# Patient Record
Sex: Female | Born: 1960 | Race: White | Hispanic: No | Marital: Single | Smoking: Former smoker
Health system: Southern US, Community
[De-identification: ages and names within clinical notes are randomized; demographics above are authoritative.]

## PROBLEM LIST (undated history)

## (undated) DIAGNOSIS — Z86010 Personal history of colonic polyps: Secondary | ICD-10-CM

## (undated) DIAGNOSIS — D649 Anemia, unspecified: Secondary | ICD-10-CM

## (undated) DIAGNOSIS — J45909 Unspecified asthma, uncomplicated: Secondary | ICD-10-CM

## (undated) DIAGNOSIS — T4145XA Adverse effect of unspecified anesthetic, initial encounter: Secondary | ICD-10-CM

## (undated) DIAGNOSIS — N189 Chronic kidney disease, unspecified: Secondary | ICD-10-CM

## (undated) DIAGNOSIS — R030 Elevated blood-pressure reading, without diagnosis of hypertension: Secondary | ICD-10-CM

## (undated) DIAGNOSIS — N8502 Endometrial intraepithelial neoplasia [EIN]: Secondary | ICD-10-CM

## (undated) DIAGNOSIS — T8859XA Other complications of anesthesia, initial encounter: Secondary | ICD-10-CM

## (undated) DIAGNOSIS — Z87442 Personal history of urinary calculi: Secondary | ICD-10-CM

## (undated) HISTORY — PX: COLONOSCOPY: SHX174

## (undated) HISTORY — DX: Personal history of colonic polyps: Z86.010

## (undated) HISTORY — DX: Unspecified asthma, uncomplicated: J45.909

## (undated) HISTORY — PX: COLONOSCOPY W/ POLYPECTOMY: SHX1380

---
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
## 1994-10-28 HISTORY — PX: INGUINAL HERNIA REPAIR: SUR1180

## 1996-10-28 HISTORY — PX: SALIVARY GLAND SURGERY: SHX768

## 2001-12-28 ENCOUNTER — Encounter: Admission: RE | Admit: 2001-12-28 | Discharge: 2001-12-28 | Payer: Self-pay | Admitting: Occupational Medicine

## 2001-12-28 ENCOUNTER — Encounter: Payer: Self-pay | Admitting: Occupational Medicine

## 2005-10-28 HISTORY — PX: OVARIAN CYST REMOVAL: SHX89

## 2005-10-28 HISTORY — PX: MYOMECTOMY: SHX85

## 2007-11-17 ENCOUNTER — Encounter (INDEPENDENT_AMBULATORY_CARE_PROVIDER_SITE_OTHER): Payer: Self-pay | Admitting: Obstetrics & Gynecology

## 2007-11-17 ENCOUNTER — Inpatient Hospital Stay (HOSPITAL_COMMUNITY): Admission: RE | Admit: 2007-11-17 | Discharge: 2007-11-19 | Payer: Self-pay | Admitting: Obstetrics & Gynecology

## 2008-01-07 ENCOUNTER — Encounter: Admission: RE | Admit: 2008-01-07 | Discharge: 2008-01-07 | Payer: Self-pay | Admitting: Obstetrics & Gynecology

## 2010-11-18 ENCOUNTER — Encounter: Payer: Self-pay | Admitting: Obstetrics & Gynecology

## 2011-03-12 NOTE — Op Note (Signed)
NAMEEARLYNE, Ashley Moon                 ACCOUNT NO.:  [id number]   MEDICAL RECORD NO.:  [id number]          PATIENT TYPE:  INP   LOCATION:  9305                          FACILITY:  [facility]   PHYSICIAN:  Freddy Finner, M.D.   DATE OF BIRTH:  08/14/1961   DATE OF PROCEDURE:  11/17/2007  DATE OF DISCHARGE:                               OPERATIVE REPORT   PREOPERATIVE DIAGNOSES:  1. Multiple uterine leiomyomata.  2. Persistent 5 cm right ovarian cyst.   POSTOPERATIVE DIAGNOSES:  1. Multiple uterine leiomyomata.  2. Persistent 5 cm right ovarian cyst.  3. Endometrioma of right ovary.   PROCEDURES:  1. Laparotomy with multiple myomectomies of uterine leiomyomata.  2. Excision of right ovarian cyst.  3. Repair of right ovary.   SURGEON:  Freddy Finner, M.D.   ASSISTANT:  Guy Sandifer. Henderson Cloud, M.D.   ESTIMATED INTRAOPERATIVE BLOOD LOSS:  1000 mL.   OTHER INTRAOPERATIVE COMPLICATIONS:  None.   ANESTHESIA:  General endotracheal.   Details of the present illness were recorded in the admission note.  The  patient was admitted on the morning of surgery.  She was given a bolus  of Ancef IV preoperatively.  She was placed in PAS serial compression  hose. She was brought to the operating room and there placed under  adequate general anesthesia, placed in the dorsal recumbent position.  With knees elevated by a pillow, the abdomen, perineum, and vagina were  prepped in the usual fashion, prepped with scrub and Betadine solution.  A Foley catheter was placed using sterile technique.  Sterile drapes  were applied.   A low abdominal transverse incision was made and carried sharply down to  fascia.  Fascia was entered sharply and extended to the external skin  incision.  Rectus sheath was developed superiorly and inferiorly with  blunt and sharp dissection.  Bleeding subcutaneous and subfascial  vessels was controlled with Bovie.  Rectus muscles were divided in the  midline.  Peritoneum was  identified and elevated with the forceps after  blunt dissection properitoneal fat.  It was then entered sharply and  extended bluntly to the extent of the skin incision.  Manual exploration  of the upper abdomen was carried out with a normal palpable liver,  normal gallbladder, normal  kidneys.  No apparent palpable  abnormalities.  The appendix was palpably normal but not visible even  with traction in the retrocecal location.  The pelvis and lower abdomen  were filled  with an enlarged uterus.  This precluded east visualization  of either ovary.  It was elected to start the myomectomy before  completely evaluating the ovaries.  An O'Connor-O'Sullivan self-  retaining retractor was placed.  More packs were used to pack the  intestinal contents  out of the pelvis.  A myoma on the posterior  uterine surface was grasped with ring forceps,  and this was used to  elevate the uterus initially.  The small defect there was closed with a  suture that was held for elevation to offer initial exposure.  A moist  pack was placed behind  the uterine lower segment and posterior uterine  surface.  Numerous myomas were palpable and visible except for one which  seemed to be intramural or submucosal.  There was one very superficial  small lesion on the posterior fundus on the lower uterine segment.  A  large incision was made across the dome of the fundus and from it the  largest myoma excised, measuring probably in the range of 6 cm in  diameter.  With careful sharp and blunt dissection, it was completely  removed.  Bleeding sources were controlled with interrupted zero  Monocryl sutures.  Additional myomas could be palpated and retrieved,  placing a finger into the endometrial cavity and identifying additional  lesions which were removed to the left of midline and inferiorly.  After  removing several myomas, all that were palpable  in this location, the  myometrium was closed with figure-of-eight of zero  Monocryl.  The  superficial incision was left for closure later.   An incision was made along the posterior fundal surface and the second  largest lesion removed, measuring probably in the range of 4 cm.  Again,  the defect was closed with interrupted figure-of-eight of zero Monocryl.  At this point, the 2 primary incisions were closed with running locking  zero Monocryl for the superficial and secondary closure which were done  in a baseball stitch fashion.   Careful palpation was continued of the uterus and lower uterine segment  in particular, and there were what seemed to be small nodules in the  extreme lower part of the anterior lower uterine segment which were  thought to be subcentimeter myomas.  The entire lower segment seemed  very symmetrical but thickened and bulbous.  For that reason, the uterus  was elevated up out of the pelvis, and incision was made over the lower  uterine segment anterior which allowed an irregular myoma with 2 smaller  ones attached to be excised.  This measured approximately 4 cm in  diameter. Some of the lesions including this one seemed to be somewhat  degenerated.  This was identified on the pathology report for special  inspection.  The removal of these lesions officially completed the  myomectomy since all the palpable lesions were completely removed. This  defect was closed with interrupted figure-of-eight sutures of zero  Monocryl for both the deep and superficial excision.   Additional moist packs were placed into the abdomen and pelvis  posteriorly to elevate the cystically enlarged right ovary and to bring  it into the operative field.  A scalpel blade was then used to incise  the cortex of the ovary, and the process of the ovary spilled a  chocolate material which was one of the differential diagnoses of this  particular cyst that it was endometriotic.  The cyst lining was  progressively sharply dissected free from the ovary as  much as   possible, and small segments of cortex excised to completely remove the  cyst and the cyst lining with the exception of small areas at the most  inferior portion which, in fact, is the portion of the ovary nearest the  utero-ovarian ligament.  This area was very close to the ovarian  medullary blood supply, and it was elected to fulgurate what small  remaining tissue in the lining was present there.  The ovary was then  closed with 3-0 Monocryl with a running locking superficial layer.  A  figure-of-eight and 2 mattress sutures were required for medullary  hemostasis  after closing the superficial layer of the ovary.  Copious  irritation was then carried out.  It was felt that hemostasis was  complete.  All pack, needle, and instruments were then removed from the  abdomen.  Counts were correct.  The abdominal incision was closed in  layers with running zero Monocryl to close the peritoneum and  reapproximate the  rectus muscles. Double-looped zero PDS was used to close the fascia,  running from angle to angle.  Subcutaneous tissue was approximated with  running 2-0 plain.  Skin was closed with wide skin staples and 1/4 inch  Steri-Strips.  The patient was awakened, taken to the recovery room in  good condition.      Freddy Finner, M.D.  Electronically Signed     WRN/MEDQ  D:  11/17/2007  T:  11/17/2007  Job:  [id number]

## 2011-03-12 NOTE — H&P (Signed)
Ashley Moon, Ashley Moon                 ACCOUNT NO.:  [id number]   MEDICAL RECORD NO.:  [id number]          PATIENT TYPE:  AMB   LOCATION:  SDC                           FACILITY:  [facility]   PHYSICIAN:  Freddy Finner, M.D.   DATE OF BIRTH:  06-Sep-1961   DATE OF ADMISSION:  11/17/2007  DATE OF DISCHARGE:                              HISTORY & PHYSICAL   ADMITTING DIAGNOSES:  1. Persistent large right ovarian cyst.  2. Multiple large uterine leiomyomata.   The patient is a 50 year old single white female, nulligravida, who was  first seen in December 2007 following an ultrasound at another facility  showing uterine fibroids.  She also has history of anemia and  menorrhagia.  Following that office visit, which was remarkable for  marked enlargement of the uterus extending up to approximately the  umbilicus and filling the pelvis, she had a pelvic ultrasound which  showed multiple large uterine leiomyomata and showed a 5.1 x 2.8 x 3.3  cm cyst in the right adnexa consistent with possible hemorrhagic cyst  versus endometrioma.  The the patient emphatically stated that it was  not possible for her to have surgery at that time in spite of  recommendations for her to do so and she has been followed with serial  ultrasounds over the next year and a half which I have confirmed  stability of the right adnexal mass but have shown it to persist and  relative stability of the uterine leiomyomata.  She has reached the  point where she is willing to proceed with surgery and is admitted now  for an open procedure for right ovarian cystectomy and myomectomy.   CURRENT REVIEW OF SYSTEMS:  Is negative.  Her menorrhagia has been  controlled with YAZ oral contraceptives.  She denies any  cardiopulmonary, GI or GU complaints.   PAST MEDICAL HISTORY:  The patient has no known significant medical  illnesses.  Her only chronic medication is oral contraceptive and iron  supplement.   PREVIOUS SURGICAL PROCEDURES  INCLUDE:  1. Hernia repair in 1996.  2. Resection of a left salivary gland in 1997.  3. Appendectomy in 1996.   DRUG ALLERGIES:  Are to SULFA AND MORPHINE.   FAMILY HISTORY:  Noncontributory.   PHYSICAL EXAM:  HEENT:  Is grossly within normal limits.  Blood pressure  the office 140/90.  Thyroid gland is not palpably enlarged.  CHEST:  Is clear to auscultation throughout.  HEART:  Normal sinus rhythm without murmurs, rubs or gallops.  BREAST EXAM:  Was considered to be normal.  No palpable masses.  No skin  change or nipple discharge.  ABDOMEN:  Is remarkable only for the uterus which was palpable nearly to  the level of the umbilicus.  There is no other apparent organomegaly or  CVA tenderness.  PELVIC EXAMINATION:  External genitalia, vagina and cervix are normal.  Bimanual reveals the uterus to be anterior in position and filling the  pelvis and rising up to approximately the umbilicus.  The right adnexal  mass is not palpably separate from the uterine mass.  RECTUM:  Is palpably normal and confirms the above findings.  EXTREMITIES: Without cyanosis, clubbing or edema.   ASSESSMENT:  Large right ovarian cyst, persistent multiple large uterine  leiomyomata.   PLAN:  Laparotomy with myomectomy and right ovarian cystectomy.  The  operative procedure has been discussed at length with the patient  including the possibility of having to perform a hysterectomy, although  this is done only for life-threatening intraoperative complications or  inability to achieve the procedure planned.  She has also been counseled  on the potential risk of infection, hemorrhage and injury to other  organs.  The patient is now admitted and prepared to proceed with  surgery.      Freddy Finner, M.D.  Electronically Signed     WRN/MEDQ  D:  11/16/2007  T:  11/16/2007  Job:  [id number]

## 2011-03-15 NOTE — Discharge Summary (Signed)
Ashley Moon, Ashley Moon                 ACCOUNT NO.:  [id number]   MEDICAL RECORD NO.:  [id number]          PATIENT TYPE:  INP   LOCATION:  9305                          FACILITY[facility]   PHYSICIAN:  Freddy Finner, M.D.   DATE OF BIRTH:  08-09-1961   DATE OF ADMISSION:  11/17/2007  DATE OF DISCHARGE:  11/19/2007                               DISCHARGE SUMMARY   DISCHARGE DIAGNOSES:  1. Multiple uterine leiomyomata.  2. Benign endometriotic cyst of left ovary.   OPERATIVE PROCEDURE:  Exploratory laparotomy with multiple excision of  leiomyomata totaling number of 15 with __________  ranging from 0.5 to 6  cm plus left ovarian cystectomy and repair of ovary.  Intraoperative  complications none, although moderate blood loss was encountered because  of the multiple leiomyomata.  Postoperative complications:  None.   DISPOSITION:  The patient was in satisfactory improved condition at the  time of her discharge on the second postoperative day.  She is to have  progressively increasing physical activity.  She is to take a vitamin  supplement with iron.  She is given Percocet to be taken as needed for  postoperative pain.  She is to return to the office in two weeks for  postoperative visit.  She is call for fever, heavy vaginal bleeding or  severe pain.  She is to take a regular diet.  She is to avoid strenuous  activities.   Details of Present Illness, Past History, Family History, Review of  Systems, and Physical Exam are recorded in the admission note.  The  patient has had a longstanding complex left adnexal mass dating back to  2007.  She also has numerous leiomyomata which have been documented by  ultrasound also.  For logistical reasons she has refused surgery until  the present time.  She now has a window of opportunity appropriate for  her working situation to do the surgery and is admitted now for  myomectomy and for ovarian cystectomy.  Physical findings on admission  remarkable  only for the pelvic findings with marked enlargement of the  uterus to a level approximately 2-3 cm below the umbilicus.   LABORATORY DATA:  Preoperative laboratory data included a normal CBC  with hemoglobin of 15.6, normal prothrombin time, PTT, and urinalysis.  Postoperatively her hemoglobin fell to a level of 9.8 which is  appropriate for the estimated loss of blood.   HOSPITAL COURSE:  The patient was admitted on the morning of surgery.  Perioperatively she was given an antibiotic.  She was placed in PAS  hose.  The above described procedure was accomplished without any  serious complications.  She did have an approximately 1000 mL blood loss  which is not unexpected for this procedure.  Postoperatively she did  very well.  She remained afebrile throughout the hospital stay.  By the  morning of the  second day she was ambulating without difficulty, tolerating a regular  diet, having adequate bowel and bladder function.  The skin staples were  removed.  Steri-Strips were left in place.  She was discharged home with  disposition as noted above.      Freddy Finner, M.D.  Electronically Signed     WRN/MEDQ  D:  11/20/2007  T:  11/20/2007  Job:  [id number]

## 2011-07-18 LAB — CBC
HCT: 26.6 — ABNORMAL LOW
HCT: 44.5
Hemoglobin: 15.4 — ABNORMAL HIGH
MCHC: 34.5
MCHC: 34.6
MCV: 88.7
MCV: 88.7
MCV: 88.7
MCV: 89.2
Platelets: 215
Platelets: 235
Platelets: 312
RBC: 3 — ABNORMAL LOW
RBC: 3.23 — ABNORMAL LOW
RBC: 3.87
RBC: 5.02
RDW: 14.1
RDW: 14.3
WBC: 11.8 — ABNORMAL HIGH
WBC: 6.1
WBC: 9.6

## 2011-07-18 LAB — APTT: aPTT: 32

## 2011-07-18 LAB — DIFFERENTIAL
Basophils Absolute: 0
Basophils Relative: 1
Eosinophils Absolute: 0.2
Eosinophils Relative: 3
Lymphocytes Relative: 23
Lymphs Abs: 1.4
Monocytes Absolute: 0.7
Monocytes Relative: 12
Neutro Abs: 3.8
Neutrophils Relative %: 61

## 2011-07-18 LAB — PROTIME-INR
INR: 1
Prothrombin Time: 12.9

## 2011-07-18 LAB — URINALYSIS, ROUTINE W REFLEX MICROSCOPIC
Bilirubin Urine: NEGATIVE
Hgb urine dipstick: NEGATIVE
Ketones, ur: 15 — AB
Nitrite: NEGATIVE
Protein, ur: NEGATIVE
Specific Gravity, Urine: 1.025
Urobilinogen, UA: 0.2

## 2011-07-23 ENCOUNTER — Ambulatory Visit: Payer: Self-pay

## 2011-07-23 ENCOUNTER — Other Ambulatory Visit: Payer: Self-pay | Admitting: Occupational Medicine

## 2011-07-23 DIAGNOSIS — M79672 Pain in left foot: Secondary | ICD-10-CM

## 2012-07-13 ENCOUNTER — Ambulatory Visit
Admission: RE | Admit: 2012-07-13 | Discharge: 2012-07-13 | Disposition: A | Discharge status: Home / Self Care | Payer: Worker's Compensation | Source: Ambulatory Visit | Attending: Occupational Medicine | Admitting: Occupational Medicine

## 2012-07-13 ENCOUNTER — Other Ambulatory Visit: Payer: Self-pay | Admitting: Occupational Medicine

## 2012-07-13 DIAGNOSIS — S9030XA Contusion of unspecified foot, initial encounter: Secondary | ICD-10-CM

## 2012-10-11 IMAGING — CR DG FOOT COMPLETE 3+V*L*
4 series · 4 of 4 positions shown · non-contrast
Comparison: None.

CLINICAL DATA: Blunt trauma.  Swelling of first and second toes.

LEFT FOOT - COMPLETE 3+ VIEW

[view not recorded (1 of 4)]
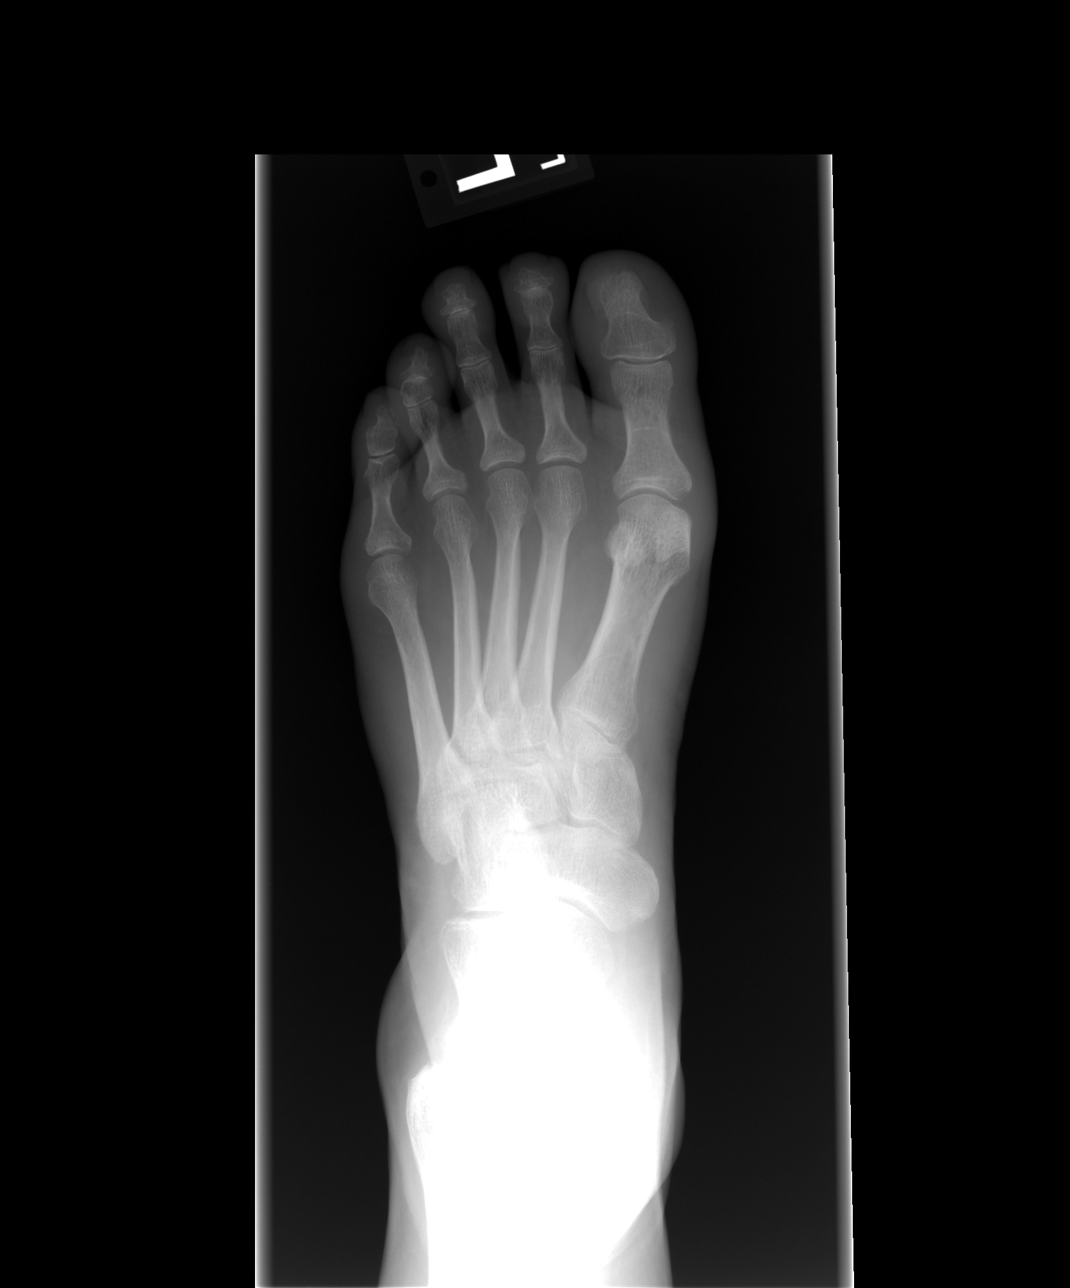

[view not recorded (2 of 4)]
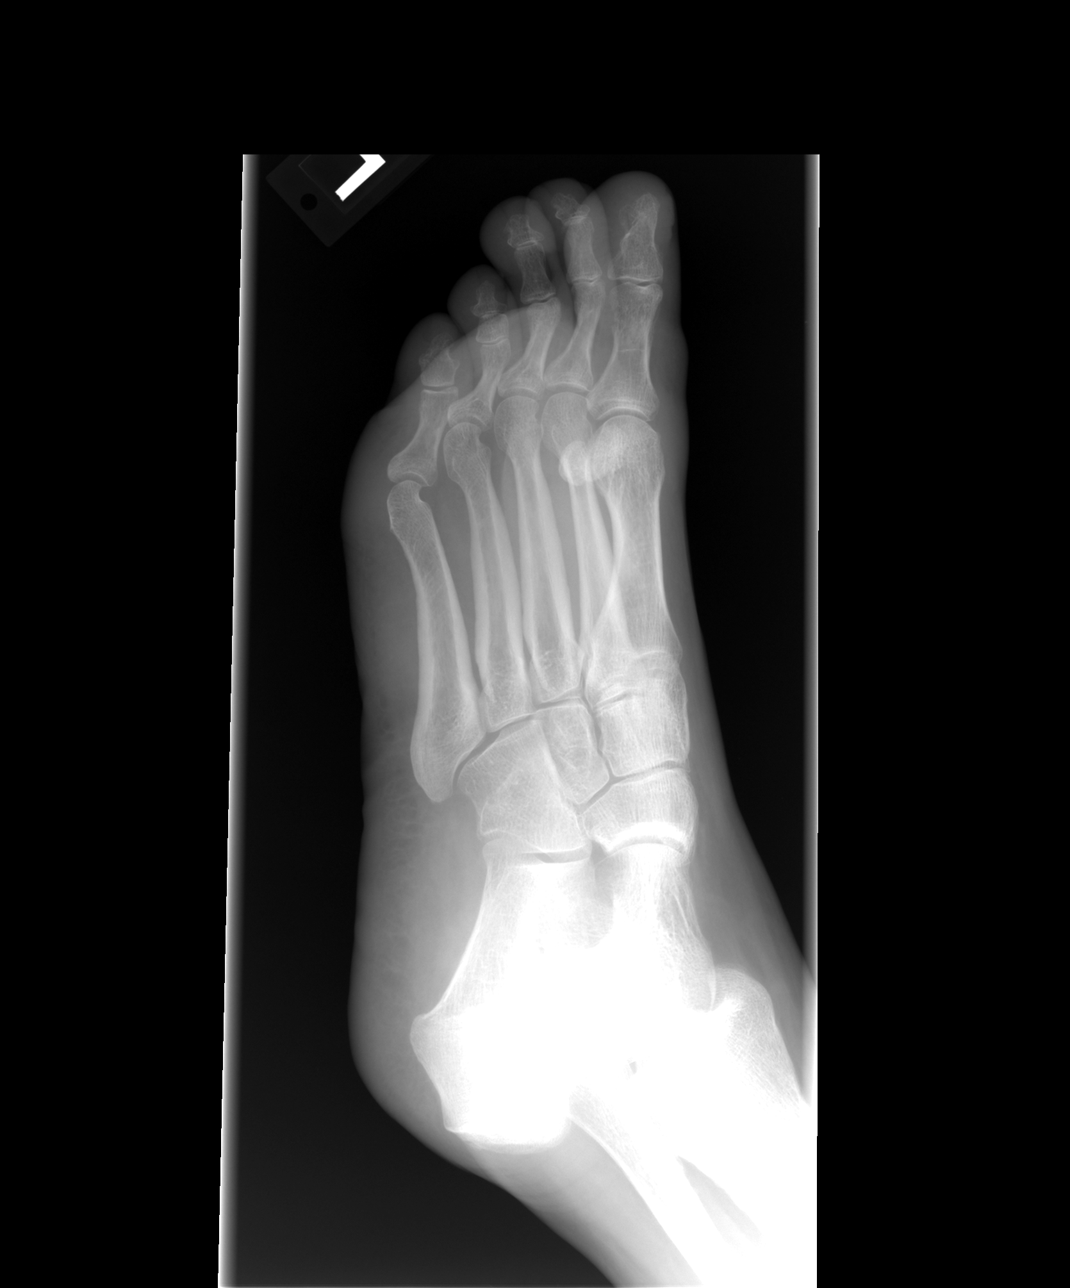

[view not recorded (3 of 4)]
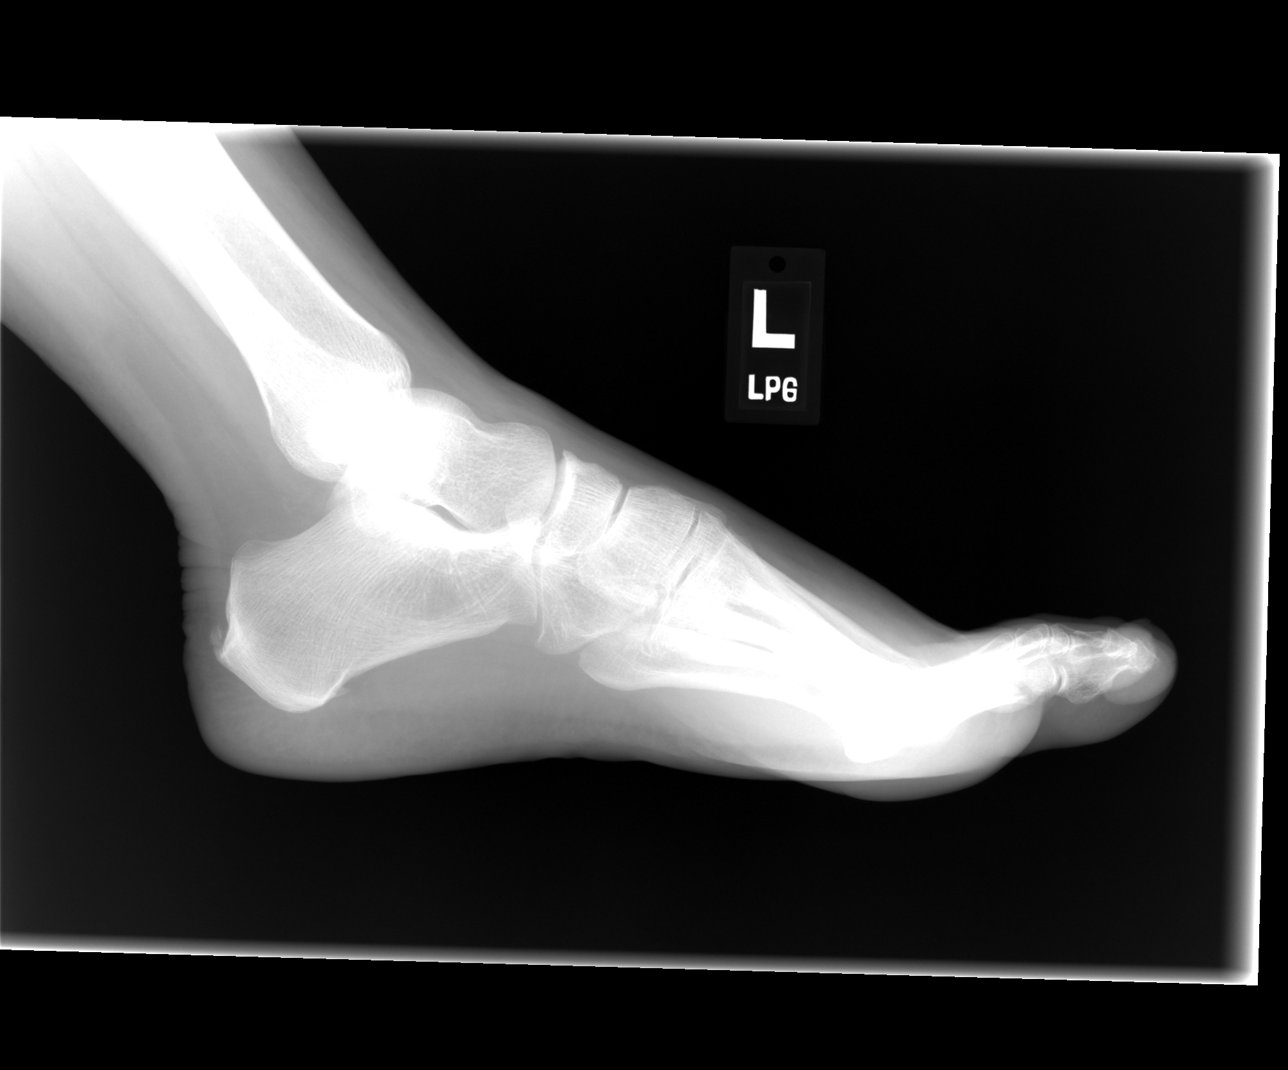

[view not recorded (4 of 4)]
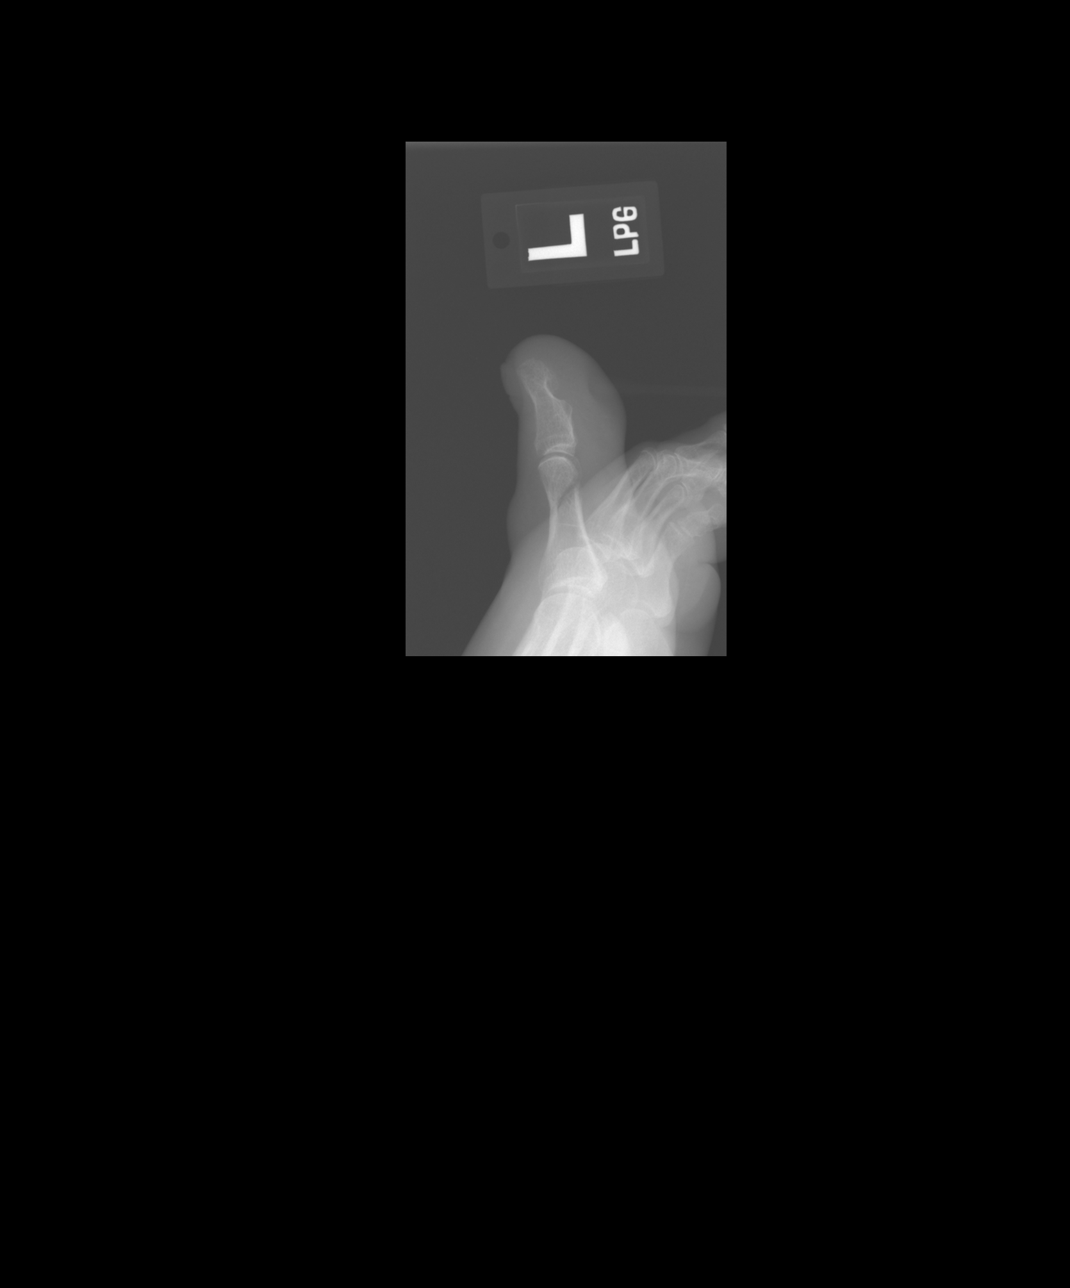

[4 of 4 positions shown; findings below may reference images not displayed]

FINDINGS: There is a minimally-displaced fracture through the
proximal phalanx of the left great toe.  No visible intra-articular
extension.  Overlying soft tissue swelling.
IMPRESSION: Oblique fracture through the shaft of the left first proximal
phalanx.

## 2013-07-28 ENCOUNTER — Encounter: Payer: Self-pay | Admitting: Internal Medicine

## 2013-10-02 IMAGING — CR DG FOOT COMPLETE 3+V*L*
3 series · 3 of 3 positions shown · non-contrast
Comparison: 07/23/2011

CLINICAL DATA: Pain and swelling at first and second toes, injury 2
days ago

LEFT FOOT - COMPLETE 3+ VIEW

[t foot ap left]
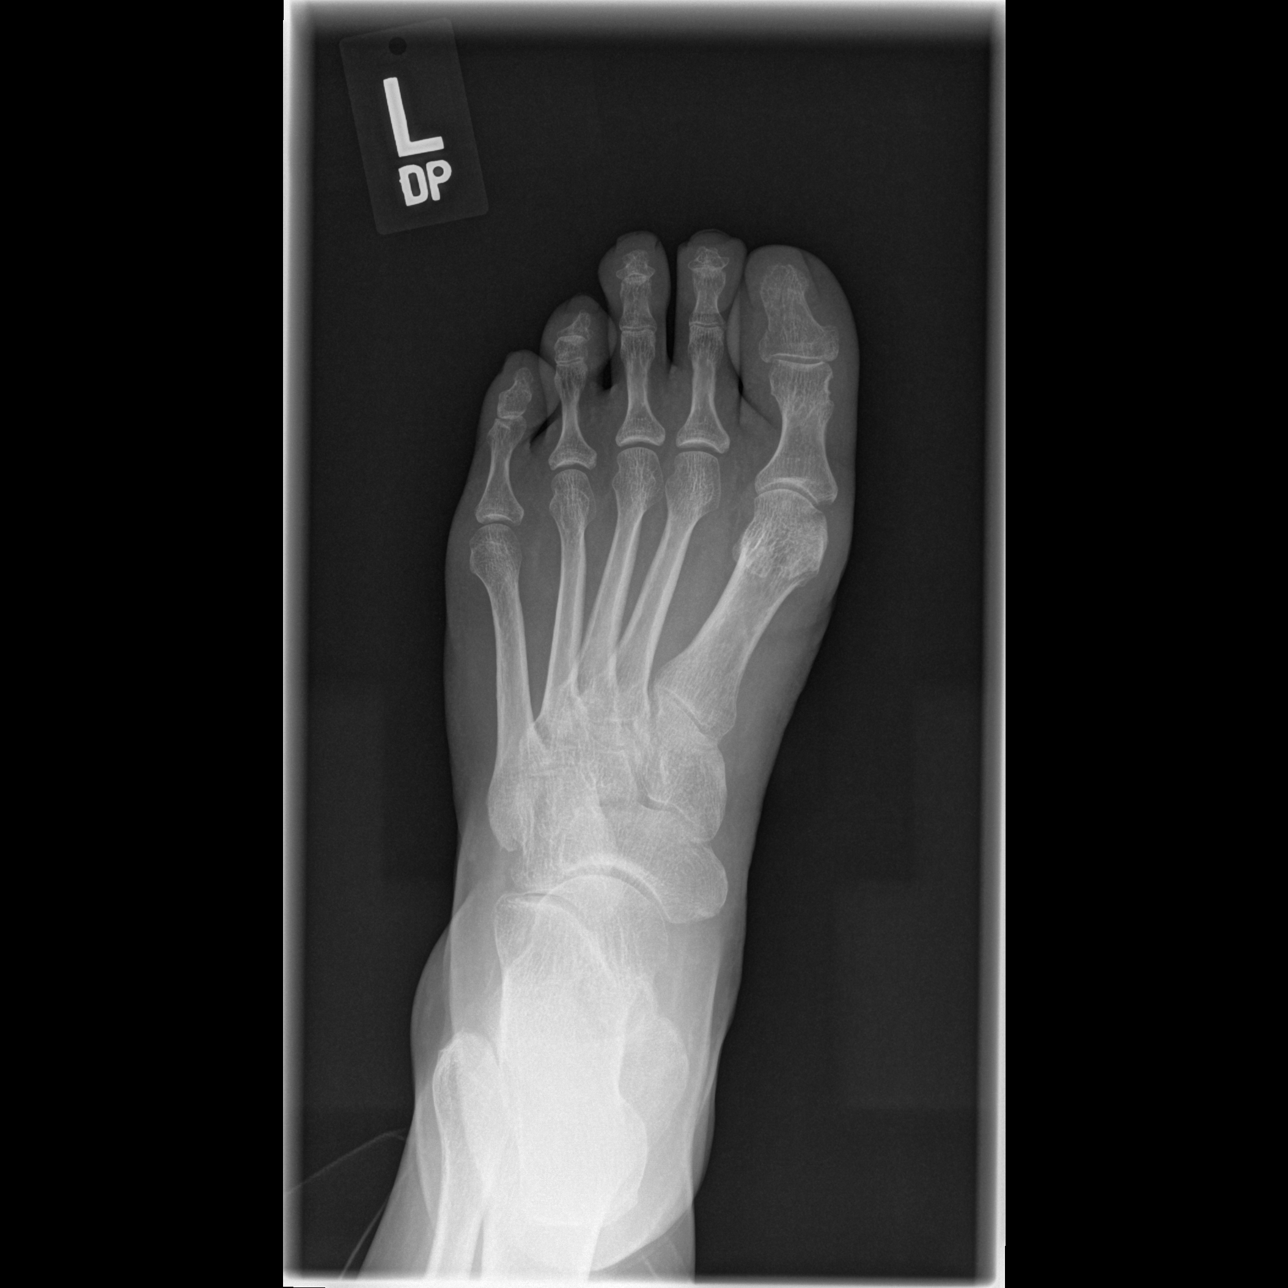

[t foot oblique left]
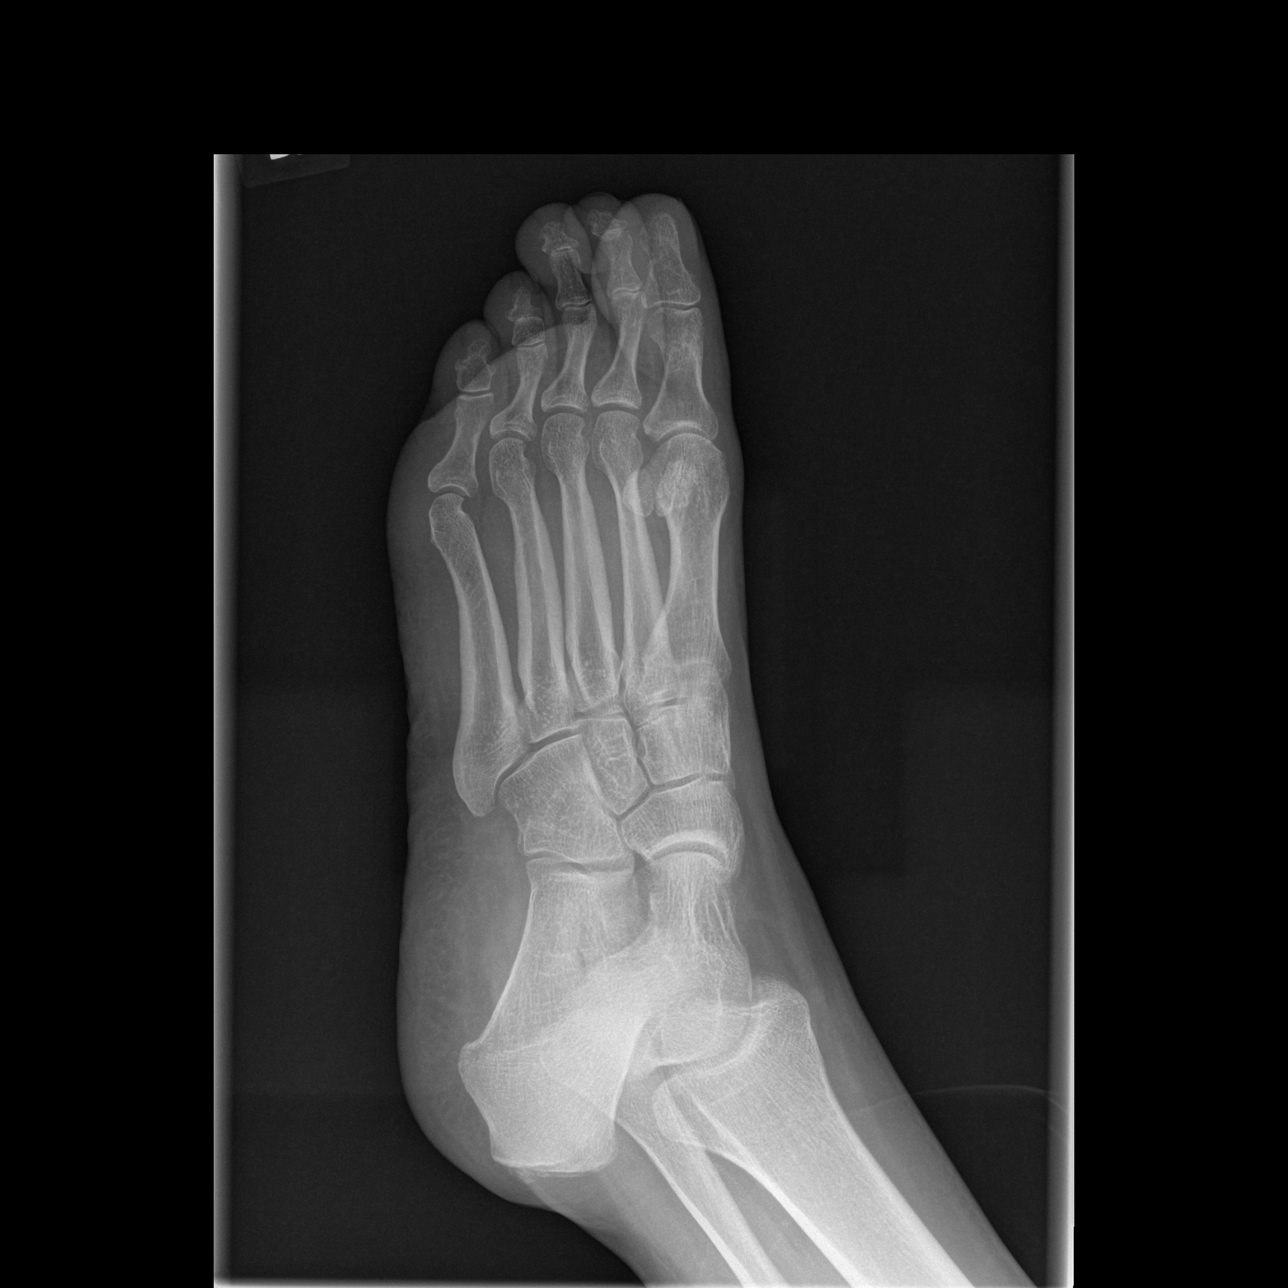

[t foot lat left]
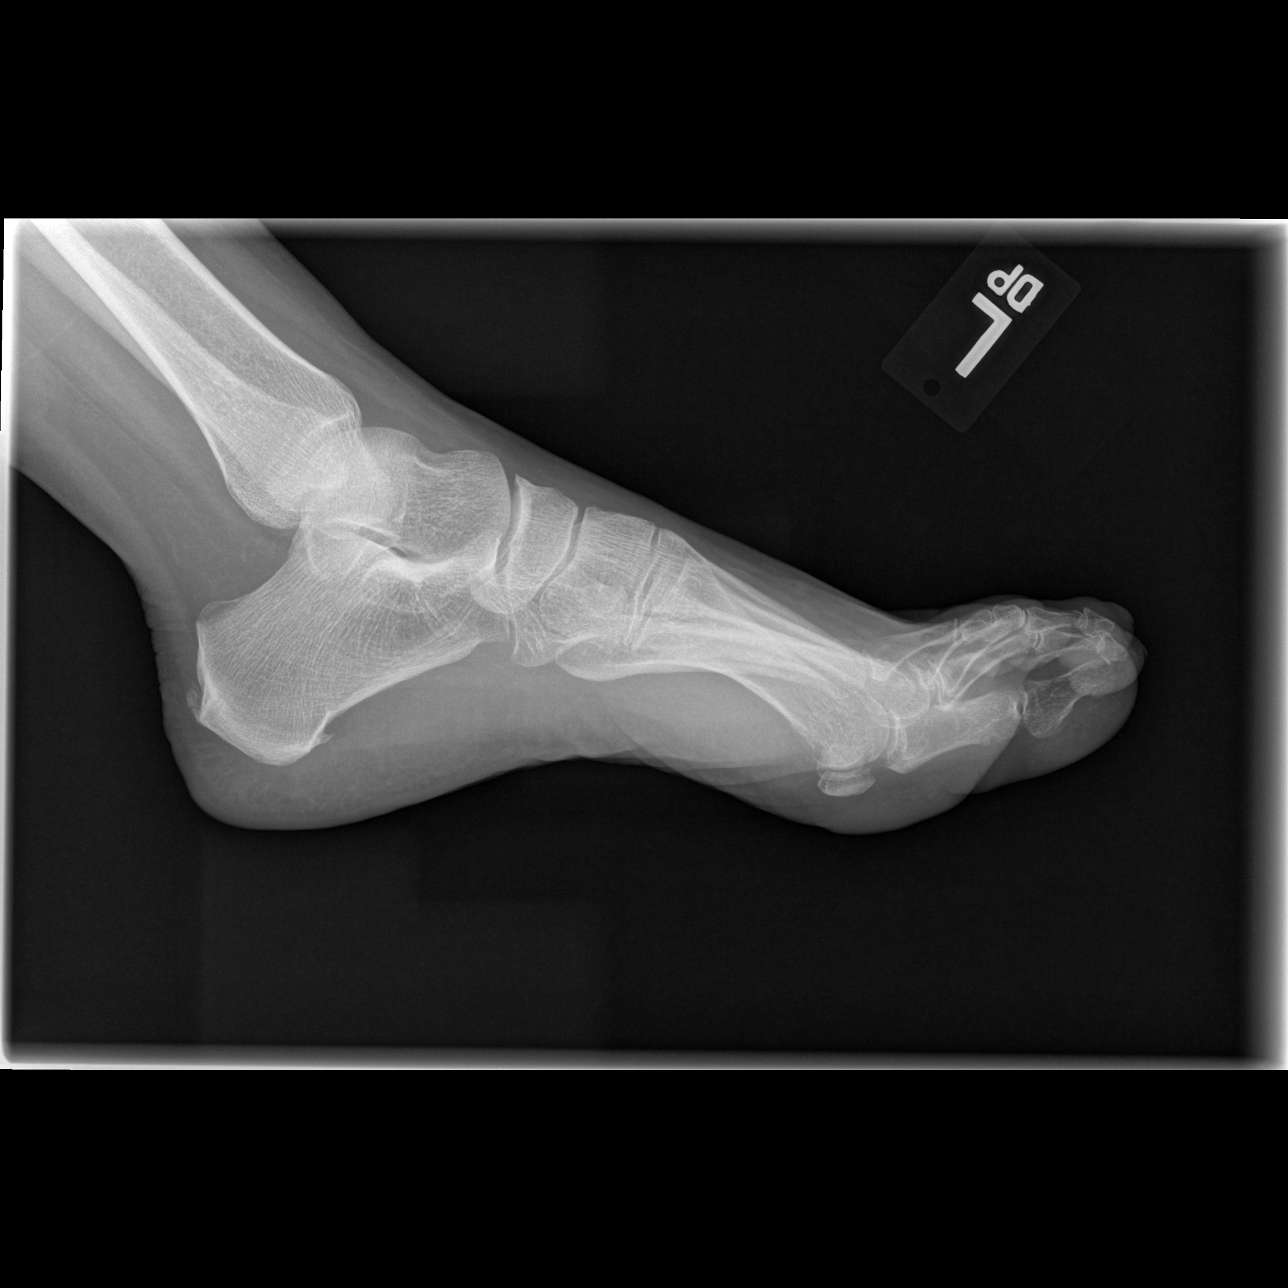

[3 of 3 positions shown; findings below may reference images not displayed]

FINDINGS: Osseous demineralization.
Nondisplaced fracture identified at base of the distal phalanx
great toe.
No definite intra-articular extension.
Questionable longitudinal component of fracture extending towards
the tuft.
Joint spaces preserved.
Deformity of proximal phalanx great toe compatible with sequela of
previous fracture.
No additional fracture, dislocation, or bone destruction.
IMPRESSION: Nondisplaced fracture at base of distal phalanx left great toe as
above.
Old healed fracture at proximal phalanx left great toe.
Osseous demineralization.

## 2013-10-14 ENCOUNTER — Encounter: Payer: Self-pay | Admitting: Internal Medicine

## 2013-11-15 ENCOUNTER — Ambulatory Visit (AMBULATORY_SURGERY_CENTER): Payer: Self-pay | Admitting: *Deleted

## 2013-11-15 VITALS — Ht 63.75 in | Wt 194.4 lb

## 2013-11-15 DIAGNOSIS — Z1211 Encounter for screening for malignant neoplasm of colon: Secondary | ICD-10-CM

## 2013-11-15 MED ORDER — NA SULFATE-K SULFATE-MG SULF 17.5-3.13-1.6 GM/177ML PO SOLN: 1.0000 | Freq: Once | ORAL | Status: DC

## 2013-11-15 NOTE — Progress Notes (Signed)
No allergies to eggs or soy. No problems with anesthesia.  

## 2013-11-17 ENCOUNTER — Encounter: Payer: Self-pay | Admitting: Internal Medicine

## 2013-11-29 ENCOUNTER — Ambulatory Visit (AMBULATORY_SURGERY_CENTER): Payer: No Typology Code available for payment source | Admitting: Internal Medicine

## 2013-11-29 ENCOUNTER — Encounter: Payer: Self-pay | Admitting: Internal Medicine

## 2013-11-29 VITALS — BP 141/72 | HR 68 | Temp 99.2°F | Resp 28 | Ht 63.75 in | Wt 194.0 lb

## 2013-11-29 DIAGNOSIS — Z860100 Personal history of colon polyps, unspecified: Secondary | ICD-10-CM | POA: Insufficient documentation

## 2013-11-29 DIAGNOSIS — Z1211 Encounter for screening for malignant neoplasm of colon: Secondary | ICD-10-CM

## 2013-11-29 DIAGNOSIS — D126 Benign neoplasm of colon, unspecified: Secondary | ICD-10-CM

## 2013-11-29 HISTORY — DX: Personal history of colonic polyps: Z86.010

## 2013-11-29 MED ORDER — SODIUM CHLORIDE 0.9 % IV SOLN: 500.0000 mL | INTRAVENOUS | Status: DC

## 2013-11-29 NOTE — Progress Notes (Signed)
Called to room to assist during endoscopic procedure.  Patient ID and intended procedure confirmed with present staff. Received instructions for my participation in the procedure from the performing physician.  

## 2013-11-29 NOTE — Op Note (Signed)
[facility]  [address] [place], [id number]   COLONOSCOPY PROCEDURE REPORT  PATIENT: Ashley Moon, Ashley Moon  MR#: [id number] BIRTHDATE: 1961-02-17 , 52  yrs. old GENDER: Female ENDOSCOPIST: Gatha Mayer, MD, [facility] REFERRED ZG:YFVCBS Nori Riis, M.D. PROCEDURE DATE:  11/29/2013 PROCEDURE:   Colonoscopy with snare polypectomy First Screening Colonoscopy - Avg.  risk and is 50 yrs.  old or older Yes.  Prior Negative Screening - Now for repeat screening. N/A  History of Adenoma - Now for follow-up colonoscopy & has been > or = to 3 yrs.  N/A  Polyps Removed Today? Yes. ASA CLASS:   Class II INDICATIONS:average risk screening and first colonoscopy. MEDICATIONS: propofol (Diprivan) 300mg  IV, MAC sedation, administered by CRNA, and These medications were titrated to patient response per physician's verbal order  DESCRIPTION OF PROCEDURE:   After the risks benefits and alternatives of the procedure were thoroughly explained, informed consent was obtained.  A digital rectal exam revealed no abnormalities of the rectum.   The [facility] [id number]-H190 [id number]  endoscope was introduced through the anus and advanced to the cecum, which was identified by both the appendix and ileocecal valve. No adverse events experienced.   The quality of the prep was Suprep good  The instrument was then slowly withdrawn as the colon was fully examined.   COLON FINDINGS: Four diminutive sessile polyps were found in the transverse colon, descending colon, and sigmoid colon.  A polypectomy was performed with a cold snare.  The resection was complete and the polyp tissue was completely retrieved.   The colon mucosa was otherwise normal.   A right colon retroflexion was performed.  Retroflexed views revealed no abnormalities. The time to cecum=4 minutes 32 seconds.  Withdrawal time=7 minutes 28 seconds.  The scope was withdrawn and the procedure completed. COMPLICATIONS: There were no complications.  ENDOSCOPIC  IMPRESSION: 1.   Four diminutive sessile polyps were found in the transverse colon, descending colon, and sigmoid colon; polypectomy was performed with a cold snare 2.   The colon mucosa was otherwise normal - good prep - first colonoscopy  RECOMMENDATIONS: Timing of repeat colonoscopy will be determined by pathology findings.   eSigned:  Gatha Mayer, MD, [facility] 11/29/2013 9:13 AM   cc: The Patient and Evette Cristal, MD

## 2013-11-29 NOTE — Patient Instructions (Addendum)
I found and removed 4 small polyps that look benign.  Otherwise the colon was normal.  I will let you know pathology results and when to have another routine colonoscopy by mail.  I appreciate the opportunity to care for you. Gatha Mayer, MD, FACG   YOU HAD AN ENDOSCOPIC PROCEDURE TODAY AT [facility]: Refer to the procedure report that was given to you for any specific questions about what was found during the examination.  If the procedure report does not answer your questions, please call your gastroenterologist to clarify.  If you requested that your care partner not be given the details of your procedure findings, then the procedure report has been included in a sealed envelope for you to review at your convenience later.  YOU SHOULD EXPECT: Some feelings of bloating in the abdomen. Passage of more gas than usual.  Walking can help get rid of the air that was put into your GI tract during the procedure and reduce the bloating. If you had a lower endoscopy (such as a colonoscopy or flexible sigmoidoscopy) you may notice spotting of blood in your stool or on the toilet paper. If you underwent a bowel prep for your procedure, then you may not have a normal bowel movement for a few days.  DIET: Your first meal following the procedure should be a light meal and then it is ok to progress to your normal diet.  A half-sandwich or bowl of soup is an example of a good first meal.  Heavy or fried foods are harder to digest and may make you feel nauseous or bloated.  Likewise meals heavy in dairy and vegetables can cause extra gas to form and this can also increase the bloating.  Drink plenty of fluids but you should avoid alcoholic beverages for 24 hours.  ACTIVITY: Your care partner should take you home directly after the procedure.  You should plan to take it easy, moving slowly for the rest of the day.  You can resume normal activity the day after the procedure however you should  NOT DRIVE or use heavy machinery for 24 hours (because of the sedation medicines used during the test).    SYMPTOMS TO REPORT IMMEDIATELY: A gastroenterologist can be reached at any hour.  During normal business hours, 8:30 AM to 5:00 PM Monday through Friday, call [phone].  After hours and on weekends, please call the GI answering service at [phone] who will take a message and have the physician on call contact you.   Following lower endoscopy (colonoscopy or flexible sigmoidoscopy):  Excessive amounts of blood in the stool  Significant tenderness or worsening of abdominal pains  Swelling of the abdomen that is new, acute  Fever of 100F or higher    FOLLOW UP: If any biopsies were taken you will be contacted by phone or by letter within the next 1-3 weeks.  Call your gastroenterologist if you have not heard about the biopsies in 3 weeks.  Our staff will call the home number listed on your records the next business day following your procedure to check on you and address any questions or concerns that you may have at that time regarding the information given to you following your procedure. This is a courtesy call and so if there is no answer at the home number and we have not heard from you through the emergency physician on call, we will assume that you have returned to your regular daily activities without  incident.  SIGNATURES/CONFIDENTIALITY: You and/or your care partner have signed paperwork which will be entered into your electronic medical record.  These signatures attest to the fact that that the information above on your After Visit Summary has been reviewed and is understood.  Full responsibility of the confidentiality of this discharge information lies with you and/or your care-partner.

## 2013-11-30 ENCOUNTER — Telehealth: Payer: Self-pay | Admitting: *Deleted

## 2013-11-30 NOTE — Telephone Encounter (Signed)
  Follow up Call-  Call back number 11/29/2013  Post procedure Call Back phone  # [phone]  Permission to leave phone message Yes     Patient questions:  Do you have a fever, pain , or abdominal swelling? no Pain Score  0 *  Have you tolerated food without any problems? yes  Have you been able to return to your normal activities? yes  Do you have any questions about your discharge instructions: Diet   no Medications  no Follow up visit  no  Do you have questions or concerns about your Care? no  Actions: * If pain score is 4 or above: No action needed, pain <4.

## 2013-12-02 ENCOUNTER — Encounter: Payer: Self-pay | Admitting: Internal Medicine

## 2013-12-02 NOTE — Progress Notes (Signed)
Quick Note:  3 diminutive adenomas Repeat colonoscopy 2018 ______

## 2014-06-02 ENCOUNTER — Other Ambulatory Visit: Payer: Self-pay | Admitting: Obstetrics & Gynecology

## 2014-06-03 LAB — CYTOLOGY - PAP

## 2014-07-25 ENCOUNTER — Ambulatory Visit (INDEPENDENT_AMBULATORY_CARE_PROVIDER_SITE_OTHER): Payer: No Typology Code available for payment source | Admitting: Family Medicine

## 2014-07-25 VITALS — BP 128/84 | HR 82 | Temp 97.8°F | Resp 18 | Ht 62.5 in | Wt 196.0 lb

## 2014-07-25 DIAGNOSIS — IMO0002 Reserved for concepts with insufficient information to code with codable children: Secondary | ICD-10-CM

## 2014-07-25 DIAGNOSIS — Z23 Encounter for immunization: Secondary | ICD-10-CM

## 2014-07-25 DIAGNOSIS — S39011A Strain of muscle, fascia and tendon of abdomen, initial encounter: Secondary | ICD-10-CM

## 2014-07-25 MED ORDER — MELOXICAM 15 MG PO TABS: 15.0000 mg | ORAL_TABLET | Freq: Every day | ORAL | Status: DC

## 2014-07-25 NOTE — Progress Notes (Signed)
Subjective:    Patient ID: Ashley Moon, female    DOB: 20-Sep-1961, 53 y.o.   MRN: [id number]  HPI Patient was walking yesterday when she turned to her left and heard and felt a popping in her right hip and lower abdomen. She has pain with bending forward, a little with walking, none with sitting still. Pain radiates into abdomen and into back from right hip. No pain in buttock or down leg. No numbness or tingling, no weakness. Frequent awakening last night from discomfort with movement. Wanted to make sure nothing was "broken."   Took tylenol without relief.   She has a history of inguinal hernia repair 1996. Has seen Dr. Nori Riis in the past for primary care, is interested in establishing care with a provider at [facility].  Past Medical History  Diagnosis Date  . Asthma   . Personal history of colonic polyps - adenomas 11/29/2013   Past Surgical History  Procedure Laterality Date  . Appendectomy  1983  . Inguinal hernia repair Right 1996  . Salivary gland surgery Left 1998  . Myomectomy  2005   Family History  Problem Relation Age of Onset  . Kidney disease Father   . Diabetes Father   . Inflammatory bowel disease Father    History  Substance Use Topics  . Smoking status: Former Smoker    Quit date: 10/29/2003  . Smokeless tobacco: Never Used  . Alcohol Use: No     Review of Systems No fever, no chills, no dysuria, no frequency, no hematuria, no diarrhea/constipation, no incontinence of bowel or bladder, occasional nausea with pain.     Objective:   Physical Exam  Vitals reviewed. Constitutional: She is oriented to person, place, and time. She appears well-developed and well-nourished.  HENT:  Head: Normocephalic and atraumatic.  Right Ear: External ear normal.  Left Ear: External ear normal.  Eyes: Conjunctivae are normal. Pupils are equal, round, and reactive to light.  Neck: Normal range of motion. Neck supple. No thyromegaly present.  Cardiovascular: Normal rate,  regular rhythm and normal heart sounds.   Pulmonary/Chest: Effort normal and breath sounds normal.  Abdominal: Soft. Bowel sounds are normal. She exhibits no distension and no mass. There is no tenderness. There is no rebound and no guarding.  Examined patient's abdomen supine and standing- non tender, no bulging/hernia.   Musculoskeletal: Normal range of motion. She exhibits no edema.       Right hip: She exhibits normal range of motion, normal strength, no tenderness, no bony tenderness, no swelling, no crepitus and no deformity.  Patient with normal gait. Able to sit/stand/lie down slowly, but without assistance. Right anterior hip/RLQ with pain with hip flexion. Full ROM, unable to reproduce pain with palpation. No pain with adduction/abduction. No trochanter tenderness.   Lymphadenopathy:    She has no cervical adenopathy.  Neurological: She is alert and oriented to person, place, and time.  Skin: Skin is warm and dry.  Psychiatric: She has a normal mood and affect. Her behavior is normal. Judgment and thought content normal.      Assessment & Plan:  1. Abdominal muscle strain, initial encounter - meloxicam (MOBIC) 15 MG tablet; Take 1 tablet (15 mg total) by mouth daily.  Dispense: 30 tablet; Refill: 0 -Provided written and verbal information regarding diagnosis and treatment. -moist heat, gentle ROM 3-4 x day -RTC if no improvement in 2-3 days, sooner if increasing pain, bowel/bladder changes, evidence of hernia.  2. Flu vaccine need - Flu  Vaccine QUAD 36+ mos IM  -F/U at 102 for CPE  Elby Beck, FNP-BC  [facility] [place] [facility]  07/25/2014 7:28 PM

## 2014-07-25 NOTE — Patient Instructions (Signed)

## 2014-10-31 ENCOUNTER — Ambulatory Visit (INDEPENDENT_AMBULATORY_CARE_PROVIDER_SITE_OTHER): Payer: No Typology Code available for payment source | Admitting: Family Medicine

## 2014-10-31 VITALS — BP 142/90 | HR 71 | Temp 99.1°F | Resp 18 | Ht 62.5 in | Wt 192.0 lb

## 2014-10-31 DIAGNOSIS — L089 Local infection of the skin and subcutaneous tissue, unspecified: Secondary | ICD-10-CM

## 2014-10-31 DIAGNOSIS — M79642 Pain in left hand: Secondary | ICD-10-CM

## 2014-10-31 DIAGNOSIS — L600 Ingrowing nail: Secondary | ICD-10-CM

## 2014-10-31 MED ORDER — CEPHALEXIN 500 MG PO CAPS: 500.0000 mg | ORAL_CAPSULE | Freq: Four times a day (QID) | ORAL | Status: DC

## 2014-10-31 NOTE — Patient Instructions (Addendum)
INGROWN TOENAIL . Keep area clean, dry and bandaged for 24 hours. . After 24 hours, remove outer bandage and leave yellow gauze in place. Ashley Moon toe/foot in warm soapy water for 5-10 minutes, once daily for 5 days. Rebandage toe after each cleaning. . Continue soaks until yellow gauze falls off. . Notify the office if you experience any of the following signs of infection: Swelling, redness, pus drainage, streaking, fever > 101.0 F     Cephalexin tablets or capsules What is this medicine? CEPHALEXIN (sef a LEX in) is a cephalosporin antibiotic. It is used to treat certain kinds of bacterial infections It will not work for colds, flu, or other viral infections. This medicine may be used for other purposes; ask your health care provider or pharmacist if you have questions. COMMON BRAND NAME(S): Biocef, Keflex, Keftab What should I tell my health care provider before I take this medicine? They need to know if you have any of these conditions: -kidney disease -stomach or intestine problems, especially colitis -an unusual or allergic reaction to cephalexin, other cephalosporins, penicillins, other antibiotics, medicines, foods, dyes or preservatives -pregnant or trying to get pregnant -breast-feeding How should I use this medicine? Take this medicine by mouth with a full glass of water. Follow the directions on the prescription label. This medicine can be taken with or without food. Take your medicine at regular intervals. Do not take your medicine more often than directed. Take all of your medicine as directed even if you think you are better. Do not skip doses or stop your medicine early. Talk to your pediatrician regarding the use of this medicine in children. While this drug may be prescribed for selected conditions, precautions do apply. Overdosage: If you think you have taken too much of this medicine contact a poison control center or emergency room at once. NOTE: This medicine is  only for you. Do not share this medicine with others. What if I miss a dose? If you miss a dose, take it as soon as you can. If it is almost time for your next dose, take only that dose. Do not take double or extra doses. There should be at least 4 to 6 hours between doses. What may interact with this medicine? -probenecid -some other antibiotics This list may not describe all possible interactions. Give your health care provider a list of all the medicines, herbs, non-prescription drugs, or dietary supplements you use. Also tell them if you smoke, drink alcohol, or use illegal drugs. Some items may interact with your medicine. What should I watch for while using this medicine? Tell your doctor or health care professional if your symptoms do not begin to improve in a few days. Do not treat diarrhea with over the counter products. Contact your doctor if you have diarrhea that lasts more than 2 days or if it is severe and watery. If you have diabetes, you may get a false-positive result for sugar in your urine. Check with your doctor or health care professional. What side effects may I notice from receiving this medicine? Side effects that you should report to your doctor or health care professional as soon as possible: -allergic reactions like skin rash, itching or hives, swelling of the face, lips, or tongue -breathing problems -pain or trouble passing urine -redness, blistering, peeling or loosening of the skin, including inside the mouth -severe or watery diarrhea -unusually weak or tired -yellowing of the eyes, skin Side effects that usually do not require medical attention (report  to your doctor or health care professional if they continue or are bothersome): -gas or heartburn -genital or anal irritation -headache -joint or muscle pain -nausea, vomiting This list may not describe all possible side effects. Call your doctor for medical advice about side effects. You may report side  effects to FDA at 1-800-FDA-1088. Where should I keep my medicine? Keep out of the reach of children. Store at room temperature between 59 and 86 degrees F (15 and 30 degrees C). Throw away any unused medicine after the expiration date. NOTE: This sheet is a summary. It may not cover all possible information. If you have questions about this medicine, talk to your doctor, pharmacist, or health care provider.  2015, Elsevier/Gold Standard. (2008-01-18 17:09:13) Infected Ingrown Toenail An infected ingrown toenail occurs when the nail edge grows into the skin and bacteria invade the area. Symptoms include pain, tenderness, swelling, and pus drainage from the edge of the nail. Poorly fitting shoes, minor injuries, and improper cutting of the toenail may also contribute to the problem. You should cut your toenails squarely instead of rounding the edges. Do not cut them too short. Avoid tight or pointed toe shoes. Sometimes the ingrown portion of the nail must be removed. If your toenail is removed, it can take 3-4 months for it to re-grow. HOME CARE INSTRUCTIONS   Soak your infected toe in warm water for 20-30 minutes, 2 to 3 times a day.  Packing or dressings applied to the area should be changed daily.  Take medicine as directed and finish them.  Reduce activities and keep your foot elevated when able to reduce swelling and discomfort. Do this until the infection gets better.  Wear sandals or go barefoot as much as possible while the infected area is sensitive.  See your caregiver for follow-up care in 2-3 days if the infection is not better. SEEK MEDICAL CARE IF:  Your toe is becoming more red, swollen or painful. MAKE SURE YOU:   Understand these instructions.  Will watch your condition.  Will get help right away if you are not doing well or get worse. Document Released: 11/21/2004 Document Revised: 01/06/2012 Document Reviewed: 10/10/2008 [facility] Patient Information 2015 [place],  [place]. This information is not intended to replace advice given to you by your health care provider. Make sure you discuss any questions you have with your health care provider.

## 2014-10-31 NOTE — Progress Notes (Signed)
Chief Complaint:  Chief Complaint  Patient presents with  . Ingrown Toenail    redness and swelling, noticed over the weekend rt foot     HPI: Ashley Moon is a 54 y.o. female who is here for  Right grerat toe nail infection for the last 1 month, no fevers or chills, she ahs ahd draiange with it and tried to cut off a piece of toe nail without relief and a piece stayed in for a coupe of weeks and then she found it. She has some bloody drainage. 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 In last 2 weeks she has been off her feet and airing it, she ahs no pain. She has also soaked it is water and peroxide. She has never had ingrown toe nail like this.   Past Medical History  Diagnosis Date  . Asthma   . Personal history of colonic polyps - adenomas 11/29/2013   Past Surgical History  Procedure Laterality Date  . Appendectomy  1983  . Inguinal hernia repair Right 1996  . Salivary gland surgery Left 1998  . Myomectomy  2005   History   Social History  . Marital Status: Single    Spouse Name: N/A    Number of Children: N/A  . Years of Education: N/A   Social History Main Topics  . Smoking status: Former Smoker    Quit date: 10/29/2003  . Smokeless tobacco: Never Used  . Alcohol Use: No  . Drug Use: No  . Sexual Activity: None   Other Topics Concern  . None   Social History Narrative   Family History  Problem Relation Age of Onset  . Kidney disease Father   . Diabetes Father   . Inflammatory bowel disease Father    Allergies  Allergen Reactions  . Erythromycin Diarrhea  . Neosporin [Neomycin-Bacitracin Zn-Polymyx] Rash  . Sulfa Antibiotics Rash   Prior to Admission medications   Medication Sig Start Date End Date Taking? Authorizing Provider  diphenhydrAMINE (BENADRYL) 25 mg capsule Take 25 mg by mouth every 6 (six) hours as needed.   Yes Historical Provider, MD  meloxicam (MOBIC) 15 MG tablet  Take 1 tablet (15 mg total) by mouth daily. 07/25/14  Yes Elby Beck, FNP     ROS: The patient denies fevers, chills, night sweats, unintentional weight loss, chest pain, palpitations, wheezing, dyspnea on exertion, nausea, vomiting, abdominal pain, dysuria, hematuria, melena, numbness, weakness, or tingling.   All other systems have been reviewed and were otherwise negative with the exception of those mentioned in the HPI and as above.    PHYSICAL EXAM: Filed Vitals:   10/31/14 0835  BP: 142/90  Pulse: 71  Temp: 99.1 F (37.3 C)  Resp: 18   Filed Vitals:   10/31/14 0835  Height: 5' 2.5" (1.588 m)  Weight: 192 lb (87.091 kg)   Body mass index is 34.54 kg/(m^2).  General: Alert, no acute distress HEENT:  Normocephalic, atraumatic, oropharynx patent. EOMI, PERRLA Cardiovascular:  Regular rate and rhythm, no rubs murmurs or gallops.  No Carotid bruits, radial pulse intact. No pedal edema.  Respiratory: Clear to auscultation bilaterally.  No wheezes, rales, or rhonchi.  No cyanosis, no use of accessory musculature GI: No organomegaly, abdomen is soft and non-tender, positive bowel sounds.  No masses. Skin:+ ingrown right great toe nail with serosainguinous dc, nontender, minimal onychomycosis Neurologic: Facial musculature  symmetric. Psychiatric: Patient is appropriate throughout our interaction. Lymphatic: No cervical lymphadenopathy Musculoskeletal: Gait intact.   LABS: Results for orders placed or performed in visit on 06/02/14  Cytology - PAP  Result Value Ref Range   CYTOLOGY - PAP PAP RESULT      EKG/XRAY:   Primary read interpreted by Dr. Marin Comment at [facility].   ASSESSMENT/PLAN: Encounter Diagnoses  Name Primary?  . Ingrown toenail Yes  . Toe infection    Rx Keflex Ingorwn toe nail   Gross sideeffects, risk and benefits, and alternatives of medications d/w patient. Patient is aware that all medications have potential sideeffects and we are unable to predict  every sideeffect or drug-drug interaction that may occur.  Kenijah Benningfield, [place], DO 10/31/2014 9:30 AM

## 2014-10-31 NOTE — Progress Notes (Signed)
Procedure: Verbal consent obtained.  Patient anesthetized via digital block with 2% lidocaine without Epi.  A nail spoon was used to lift the lateral aspect of the right great toenail and the lateral 1/3 of the toenail was removed from the nail bed.  A xeroform gauze was placed and a clean dressing was placed.  Wound care instructions provided. The patient tolerated the procedure without difficulty.  Philis Fendt, MS, PA-C   10:28 AM, 10/31/2014

## 2014-12-17 ENCOUNTER — Ambulatory Visit (INDEPENDENT_AMBULATORY_CARE_PROVIDER_SITE_OTHER): Payer: No Typology Code available for payment source | Admitting: Internal Medicine

## 2014-12-17 VITALS — BP 120/78 | HR 64 | Temp 97.7°F | Resp 16 | Ht 61.0 in | Wt 195.1 lb

## 2014-12-17 DIAGNOSIS — Z6836 Body mass index (BMI) 36.0-36.9, adult: Secondary | ICD-10-CM

## 2014-12-17 DIAGNOSIS — J029 Acute pharyngitis, unspecified: Secondary | ICD-10-CM

## 2014-12-17 LAB — POCT RAPID STREP A (OFFICE): Rapid Strep A Screen: NEGATIVE

## 2014-12-17 NOTE — Progress Notes (Signed)
   Subjective:    Patient ID: Ashley Moon, female    DOB: 1961/01/09, 54 y.o.   MRN: [id number]  HPI runny nose cough and sore throat for 48 hours No fever No nausea and vomiting Exposed to strep 4 days ago    Review of Systems Noncontributory    Objective:   Physical Exam BP 120/78 mmHg  Pulse 64  Temp(Src) 97.7 F (36.5 C) (Oral)  Resp 16  Ht 5\' 1"  (1.549 m)  Wt 195 lb 2 oz (88.508 kg)  BMI 36.89 kg/m2  SpO2 97% TMs clear /conjunctiva negative/Nares with clear rhinorrhea Throat red without exudate No nodes Chest clear  Results for orders placed or performed in visit on 12/17/14  POCT rapid strep A  Result Value Ref Range   Rapid Strep A Screen Negative Negative       Assessment & Plan:  Pharyngitis/viral URi Throat culture sent I OTC meds

## 2014-12-20 LAB — CULTURE, GROUP A STREP: Organism ID, Bacteria: NORMAL

## 2016-12-09 ENCOUNTER — Encounter: Payer: Self-pay | Admitting: Internal Medicine

## 2016-12-27 ENCOUNTER — Encounter: Payer: Self-pay | Admitting: Internal Medicine

## 2017-02-12 ENCOUNTER — Ambulatory Visit (AMBULATORY_SURGERY_CENTER): Payer: Self-pay

## 2017-02-12 VITALS — Ht 61.5 in | Wt 204.2 lb

## 2017-02-12 DIAGNOSIS — Z860100 Personal history of colon polyps, unspecified: Secondary | ICD-10-CM

## 2017-02-12 NOTE — Progress Notes (Signed)
No diet meds No home oxygen No allergies to eggs or soy No hx of problems with anesthesia  Registered for emmi

## 2017-02-14 ENCOUNTER — Encounter: Payer: Self-pay | Admitting: Internal Medicine

## 2017-02-26 ENCOUNTER — Ambulatory Visit (AMBULATORY_SURGERY_CENTER): Payer: 59 | Admitting: Internal Medicine

## 2017-02-26 ENCOUNTER — Encounter: Payer: Self-pay | Admitting: Internal Medicine

## 2017-02-26 VITALS — BP 116/48 | HR 64 | Temp 98.4°F | Resp 15 | Ht 61.0 in | Wt 204.0 lb

## 2017-02-26 DIAGNOSIS — Z86010 Personal history of colonic polyps: Secondary | ICD-10-CM | POA: Diagnosis present

## 2017-02-26 DIAGNOSIS — K635 Polyp of colon: Secondary | ICD-10-CM | POA: Diagnosis not present

## 2017-02-26 DIAGNOSIS — D120 Benign neoplasm of cecum: Secondary | ICD-10-CM

## 2017-02-26 MED ORDER — SODIUM CHLORIDE 0.9 % IV SOLN: 500.0000 mL | INTRAVENOUS | Status: DC

## 2017-02-26 NOTE — Progress Notes (Signed)
Report to PACU, RN, vss, BBS= Clear.  

## 2017-02-26 NOTE — Progress Notes (Signed)
Pt's states no medical or surgical changes since previsit or office visit. 

## 2017-02-26 NOTE — Progress Notes (Signed)
Called to room to assist during endoscopic procedure.  Patient ID and intended procedure confirmed with present staff. Received instructions for my participation in the procedure from the performing physician.  

## 2017-02-26 NOTE — Op Note (Signed)
[facility] Patient Name: Ashley Moon Procedure Date: 02/26/2017 9:00 AM MRN: [id number] Endoscopist: Gatha Mayer , MD Age: 56 Referring MD:  Date of Birth: 06-Dec-1960 Gender: Female Account #: [id number] Procedure:                Colonoscopy Indications:              Surveillance: Personal history of adenomatous                            polyps on last colonoscopy 3 years ago Medicines:                Propofol per Anesthesia, Monitored Anesthesia Care Procedure:                Pre-Anesthesia Assessment:                           - Prior to the procedure, a History and Physical                            was performed, and patient medications and                            allergies were reviewed. The patient's tolerance of                            previous anesthesia was also reviewed. The risks                            and benefits of the procedure and the sedation                            options and risks were discussed with the patient.                            All questions were answered, and informed consent                            was obtained. Prior Anticoagulants: The patient has                            taken no previous anticoagulant or antiplatelet                            agents. ASA Grade Assessment: II - A patient with                            mild systemic disease. After reviewing the risks                            and benefits, the patient was deemed in                            satisfactory condition to undergo the procedure.  After obtaining informed consent, the colonoscope                            was passed under direct vision. Throughout the                            procedure, the patient's blood pressure, pulse, and                            oxygen saturations were monitored continuously. The                            Colonoscope was introduced through the anus and                             advanced to the the cecum, identified by                            appendiceal orifice and ileocecal valve. The                            colonoscopy was performed without difficulty. The                            patient tolerated the procedure well. The quality                            of the bowel preparation was good. The bowel                            preparation used was Miralax. The ileocecal valve,                            appendiceal orifice, and rectum were photographed. Scope In: 9:08:15 AM Scope Out: 9:20:55 AM Scope Withdrawal Time: 0 hours 10 minutes 45 seconds  Total Procedure Duration: 0 hours 12 minutes 40 seconds  Findings:                 The perianal and digital rectal examinations were                            normal.                           A 2 mm polyp was found in the cecum. The polyp was                            sessile. The polyp was removed with a cold biopsy                            forceps. Resection and retrieval were complete.                            Verification of patient identification for the  specimen was done. Estimated blood loss was minimal.                           Multiple small and large-mouthed diverticula were                            found in the sigmoid colon. There was no evidence                            of diverticular bleeding.                           The exam was otherwise without abnormality on                            direct and retroflexion views. Complications:            No immediate complications. Estimated Blood Loss:     Estimated blood loss was minimal. Impression:               - One 2 mm polyp in the cecum, removed with a cold                            biopsy forceps. Resected and retrieved.                           - Moderate diverticulosis in the sigmoid colon.                            There was no evidence of diverticular bleeding.                           - The  examination was otherwise normal on direct                            and retroflexion views. Recommendation:           - Patient has a contact number available for                            emergencies. The signs and symptoms of potential                            delayed complications were discussed with the                            patient. Return to normal activities tomorrow.                            Written discharge instructions were provided to the                            patient.                           - Resume previous diet.                           -  Continue present medications.                           ant                           - Repeat colonoscopy in 5 years for surveillance. Gatha Mayer, MD 02/26/2017 9:26:55 AM This report has been signed electronically.

## 2017-02-26 NOTE — Patient Instructions (Signed)
    Handouts given:Polyps and Diverticulosis.   YOU HAD AN ENDOSCOPIC PROCEDURE TODAY AT [facility]:   Refer to the procedure report that was given to you for any specific questions about what was found during the examination.  If the procedure report does not answer your questions, please call your gastroenterologist to clarify.  If you requested that your care partner not be given the details of your procedure findings, then the procedure report has been included in a sealed envelope for you to review at your convenience later.  YOU SHOULD EXPECT: Some feelings of bloating in the abdomen. Passage of more gas than usual.  Walking can help get rid of the air that was put into your GI tract during the procedure and reduce the bloating. If you had a lower endoscopy (such as a colonoscopy or flexible sigmoidoscopy) you may notice spotting of blood in your stool or on the toilet paper. If you underwent a bowel prep for your procedure, you may not have a normal bowel movement for a few days.  Please Note:  You might notice some irritation and congestion in your nose or some drainage.  This is from the oxygen used during your procedure.  There is no need for concern and it should clear up in a day or so.  SYMPTOMS TO REPORT IMMEDIATELY:   Following lower endoscopy (colonoscopy or flexible sigmoidoscopy):  Excessive amounts of blood in the stool  Significant tenderness or worsening of abdominal pains  Swelling of the abdomen that is new, acute  Fever of 100F or higher   For urgent or emergent issues, a gastroenterologist can be reached at any hour by calling [phone].   DIET:  We do recommend a small meal at first, but then you may proceed to your regular diet.  Drink plenty of fluids but you should avoid alcoholic beverages for 24 hours.  ACTIVITY:  You should plan to take it easy for the rest of today and you should NOT DRIVE or use heavy machinery until tomorrow  (because of the sedation medicines used during the test).    FOLLOW UP: Our staff will call the number listed on your records the next business day following your procedure to check on you and address any questions or concerns that you may have regarding the information given to you following your procedure. If we do not reach you, we will leave a message.  However, if you are feeling well and you are not experiencing any problems, there is no need to return our call.  We will assume that you have returned to your regular daily activities without incident.  If any biopsies were taken you will be contacted by phone or by letter within the next 1-3 weeks.  Please call us at [phone] if you have not heard about the biopsies in 3 weeks.    SIGNATURES/CONFIDENTIALITY: You and/or your care partner have signed paperwork which will be entered into your electronic medical record.  These signatures attest to the fact that that the information above on your After Visit Summary has been reviewed and is understood.  Full responsibility of the confidentiality of this discharge information lies with you and/or your care-partner.

## 2017-02-27 ENCOUNTER — Telehealth: Payer: Self-pay

## 2017-02-27 NOTE — Telephone Encounter (Signed)
  Follow up Call-  Call back number 02/26/2017  Post procedure Call Back phone  # [phone]  Permission to leave phone message Yes  Some recent data might be hidden     Patient questions:  Do you have a fever, pain , or abdominal swelling? No. Pain Score  0 *  Have you tolerated food without any problems? Yes.    Have you been able to return to your normal activities? Yes.    Do you have any questions about your discharge instructions: Diet   No. Medications  No. Follow up visit  No.  Do you have questions or concerns about your Care? No.  Actions: * If pain score is 4 or above: No action needed, pain <4.

## 2017-03-04 ENCOUNTER — Encounter: Payer: Self-pay | Admitting: Internal Medicine

## 2017-03-04 DIAGNOSIS — Z86010 Personal history of colonic polyps: Secondary | ICD-10-CM

## 2017-03-04 NOTE — Progress Notes (Signed)
Not precancerous polyp Recall 5 yrs prior adenomas 2023

## 2018-10-26 ENCOUNTER — Other Ambulatory Visit: Payer: Self-pay | Admitting: Obstetrics & Gynecology

## 2018-10-26 HISTORY — PX: DILATION AND CURETTAGE, DIAGNOSTIC / THERAPEUTIC: SUR384

## 2018-11-06 ENCOUNTER — Telehealth: Payer: Self-pay | Admitting: *Deleted

## 2018-11-06 NOTE — Telephone Encounter (Signed)
Called and spoke with the patient, she is scheduled for 1/24 at 9:45am

## 2018-11-17 NOTE — Progress Notes (Signed)
Updated family history per Dr Verlon Au office.

## 2018-11-20 ENCOUNTER — Encounter: Payer: Self-pay | Admitting: Gynecologic Oncology

## 2018-11-20 ENCOUNTER — Inpatient Hospital Stay: Payer: 59 | Attending: Gynecologic Oncology | Admitting: Gynecologic Oncology

## 2018-11-20 VITALS — BP 150/74 | HR 72 | Temp 98.6°F | Resp 20 | Ht 62.0 in | Wt 200.1 lb

## 2018-11-20 DIAGNOSIS — N8502 Endometrial intraepithelial neoplasia [EIN]: Secondary | ICD-10-CM | POA: Insufficient documentation

## 2018-11-20 DIAGNOSIS — N8501 Benign endometrial hyperplasia: Secondary | ICD-10-CM | POA: Insufficient documentation

## 2018-11-20 NOTE — Patient Instructions (Signed)
Preparing for your Surgery  Plan for surgery on December 08, 2018 with Dr. Everitt Amber at [facility] will be scheduled for a robotic assisted total hysterectomy, bilateral salpingo-oophorectomy, sentinel lymph node biopsy.   Pre-operative Testing -You will receive a phone call from presurgical testing at [facility] to arrange for a pre-operative testing appointment before your surgery.  This appointment normally occurs one to two weeks before your scheduled surgery.   -Bring your insurance card, copy of an advanced directive if applicable, medication list  -At that visit, you will be asked to sign a consent for a possible blood transfusion in case a transfusion becomes necessary during surgery.  The need for a blood transfusion is rare but having consent is a necessary part of your care.     -You should not be taking blood thinners or aspirin at least ten days prior to surgery unless instructed by your surgeon.  Day Before Surgery at [place] will be asked to take in a light diet the day before surgery.  Avoid carbonated beverages.  You will be advised to have nothing to eat or drink after midnight the evening before.    Eat a light diet the day before surgery.  Examples including soups, broths, toast, yogurt, mashed potatoes.  Things to avoid include carbonated beverages (fizzy beverages), raw fruits and raw vegetables, or beans.   If your bowels are filled with gas, your surgeon will have difficulty visualizing your pelvic organs which increases your surgical risks.  Your role in recovery Your role is to become active as soon as directed by your doctor, while still giving yourself time to heal.  Rest when you feel tired. You will be asked to do the following in order to speed your recovery:  - Cough and breathe deeply. This helps toclear and expand your lungs and can prevent pneumonia. You may be given a spirometer to practice deep breathing. A staff  member will show you how to use the spirometer. - Do mild physical activity. Walking or moving your legs help your circulation and body functions return to normal. A staff member will help you when you try to walk and will provide you with simple exercises. Do not try to get up or walk alone the first time. - Actively manage your pain. Managing your pain lets you move in comfort. We will ask you to rate your pain on a scale of zero to 10. It is your responsibility to tell your doctor or nurse where and how much you hurt so your pain can be treated.  Special Considerations -If you are diabetic, you may be placed on insulin after surgery to have closer control over your blood sugars to promote healing and recovery.  This does not mean that you will be discharged on insulin.  If applicable, your oral antidiabetics will be resumed when you are tolerating a solid diet.  -Your final pathology results from surgery should be available around one week after surgery and the results will be relayed to you when available.  -Dr. Lahoma Crocker is the Surgeon that assists your GYN Oncologist with surgery.    -FMLA forms can be faxed to [phone] and please allow 5-7 business days for completion.   Blood Transfusion Information WHAT IS A BLOOD TRANSFUSION? A transfusion is the replacement of blood or some of its parts. Blood is made up of multiple cells which provide different functions.  Red blood cells carry oxygen and are used for blood  loss replacement.  White blood cells fight against infection.  Platelets control bleeding.  Plasma helps clot blood.  Other blood products are available for specialized needs, such as hemophilia or other clotting disorders. BEFORE THE TRANSFUSION  Who gives blood for transfusions?   You may be able to donate blood to be used at a later date on yourself (autologous donation).  Relatives can be asked to donate blood. This is generally not any safer than if  you have received blood from a stranger. The same precautions are taken to ensure safety when a relative's blood is donated.  Healthy volunteers who are fully evaluated to make sure their blood is safe. This is blood bank blood. Transfusion therapy is the safest it has ever been in the practice of medicine. Before blood is taken from a donor, a complete history is taken to make sure that person has no history of diseases nor engages in risky social behavior (examples are intravenous drug use or sexual activity with multiple partners). The donor's travel history is screened to minimize risk of transmitting infections, such as malaria. The donated blood is tested for signs of infectious diseases, such as HIV and hepatitis. The blood is then tested to be sure it is compatible with you in order to minimize the chance of a transfusion reaction. If you or a relative donates blood, this is often done in anticipation of surgery and is not appropriate for emergency situations. It takes many days to process the donated blood. RISKS AND COMPLICATIONS Although transfusion therapy is very safe and saves many lives, the main dangers of transfusion include:   Getting an infectious disease.  Developing a transfusion reaction. This is an allergic reaction to something in the blood you were given. Every precaution is taken to prevent this. The decision to have a blood transfusion has been considered carefully by your caregiver before blood is given. Blood is not given unless the benefits outweigh the risks.

## 2018-11-20 NOTE — H&P (View-Only) (Signed)
Consult Note: Gyn-Onc  Consult was requested by Dr. Nori Riis for the evaluation of Ashley Moon 58 y.o. female  CC:  Chief Complaint  Patient presents with  . complex endometrial hyperplasia    Assessment/Plan:  Ashley. Ashley Moon  is a 58 y.o.  year old with complex atypical hyperplasia of the uterus.  She is obese.  She is virginal and nulliparous with a narrow vagina.  I discussed the etiology of this disease, which in her case is likely unopposed estrogen from obesity.  I explained the natural history of complex atypical hyperplasia including a 30% risk for progression to malignancy.  I discussed that there is a 40% likelihood that there is occult malignancy that exists at present.  I discussed treatment options which include hormonal modulation with progestin therapy, in addition to definitive surgical management with hysterectomy BSO and sentinel lymph node biopsy.  The patient is electing for definitive surgical procedure.  I discussed operative risks including  bleeding, infection, damage to internal organs (such as bladder,ureters, bowels), blood clot, reoperation and rehospitalization. Discussed anticipated hospital stay.  She has a narrow vagina, a slightly greater than normal sized uterus.  Therefore there is a possibility that she required mini laparotomy for specimen delivery.   HPI: Ashley Moon is a 58 year old P0 who is seen in consultation at the request of Dr Nori Riis for complex atypical hyperplasia.  The patient reports that she became menopausal in approximately 2017.  In September 2019 she began having intermittent irregular vaginal bleeding that she felt was triggered by heavy lifting.  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  When the bleeding persisted she was evaluated by Dr. Milta Deiters.  He performed a Pap smear in November 2019 which was unremarkable.  He also performed a transvaginal ultrasound on September 09, 2018 which revealed a slightly enlarged uterus  measuring 8.5 x 6.2 x 7.2 cm with an endometrial thickness of 2.32 cm.  The left ovary was not visualized, however no adnexal masses was seen.  No right adnexal masses were seen either.  She was taken to the operating room for a surgical procedure with hysteroscopy on October 26, 2018 and sampling of an endometrial polyp took place which confirmed an endometrium with complex atypical hyperplasia.  There was also removal of submucosal benign leiomyomas.  The patient is obese, weighs 200 pounds and is 8foot 2 inches tall.  She has had a prior abdominal myomectomy with ovarian cyst removal approximately 2011.  She is also had as a separate procedure an open appendectomy.  Her sister has a history of uterine cancer.  The patient is not treated for diabetes.   Current Meds:  Outpatient Encounter Medications as of 11/20/2018  Medication Sig  . acetaminophen (TYLENOL) 500 MG tablet Take 500 mg by mouth every 6 (six) hours as needed.  . diphenhydrAMINE (BENADRYL) 25 mg capsule Take 25 mg by mouth every 6 (six) hours as needed.  Marland Kitchen acetaminophen-codeine (TYLENOL #3) 300-30 MG tablet TAKE 1 OR 2 TABLETS BY MOUTH EVERY 4 HOURS AS NEEDED FOR POST OP PAIN  . progesterone (PROMETRIUM) 100 MG capsule Take 100 mg by mouth at bedtime.  . [DISCONTINUED] fexofenadine (ALLEGRA) 180 MG tablet Take 180 mg by mouth daily.   Facility-Administered Encounter Medications as of 11/20/2018  Medication  . 0.9 %  sodium chloride infusion    Allergy:  Allergies  Allergen Reactions  . Adhesive [Tape] Itching    Itching, rash  . Erythromycin  Diarrhea  . Neosporin [Neomycin-Bacitracin Zn-Polymyx] Rash  . Sulfa Antibiotics Rash    Social Hx:   Social History   Socioeconomic History  . Marital status: Single    Spouse name: Not on file  . Number of children: Not on file  . Years of education: Not on file  . Highest education level: Not on file  Occupational History  . Not on file  Social Needs  . Financial  resource strain: Not on file  . Food insecurity:    Worry: Not on file    Inability: Not on file  . Transportation needs:    Medical: Not on file    Non-medical: Not on file  Tobacco Use  . Smoking status: Former Smoker    Last attempt to quit: 10/29/2003    Years since quitting: 15.0  . Smokeless tobacco: Never Used  Substance and Sexual Activity  . Alcohol use: No  . Drug use: No  . Sexual activity: Not on file  Lifestyle  . Physical activity:    Days per week: Not on file    Minutes per session: Not on file  . Stress: Not on file  Relationships  . Social connections:    Talks on phone: Not on file    Gets together: Not on file    Attends religious service: Not on file    Active member of club or organization: Not on file    Attends meetings of clubs or organizations: Not on file    Relationship status: Not on file  . Intimate partner violence:    Fear of current or ex partner: Not on file    Emotionally abused: Not on file    Physically abused: Not on file    Forced sexual activity: Not on file  Other Topics Concern  . Not on file  Social History Narrative  . Not on file    Past Surgical Hx:  Past Surgical History:  Procedure Laterality Date  . APPENDECTOMY  1983  . COLONOSCOPY    . INGUINAL HERNIA REPAIR Right 1996  . MYOMECTOMY  2005   rib injury occurred at this time; pt denies  'myomectomy '  . OVARIAN CYST REMOVAL  2010  . SALIVARY GLAND SURGERY Left 1998    Past Medical Hx:  Past Medical History:  Diagnosis Date  . Asthma   . Personal history of colonic polyps - adenomas 11/29/2013    Past Gynecological History:  G0, virginal. No LMP recorded. (Menstrual status: Perimenopausal).  Family Hx:  Family History  Problem Relation Age of Onset  . Kidney disease Father   . Diabetes Father   . Inflammatory bowel disease Father   . Stroke Father   . Uterine cancer Sister   . Colon cancer Neg Hx     Review of Systems:  Constitutional  Feels well,     ENT Normal appearing ears and nares bilaterally Skin/Breast  No rash, sores, jaundice, itching, dryness Cardiovascular  No chest pain, shortness of breath, or edema  Pulmonary  No cough or wheeze.  Gastro Intestinal  No nausea, vomitting, or diarrhoea. No bright red blood per rectum, no abdominal pain, change in bowel movement, or constipation.  Genito Urinary  No frequency, urgency, dysuria, + postmenopausal bleeding. Musculo Skeletal  No myalgia, arthralgia, joint swelling or pain  Neurologic  No weakness, numbness, change in gait,  Psychology  No depression, anxiety, insomnia.   Vitals:  Blood pressure (!) 150/74, pulse 72, temperature 98.6 F (37 C),  temperature source Oral, resp. rate 20, height 5\' 2"  (1.575 m), weight 200 lb 1.9 oz (90.8 kg), SpO2 97 %.  Physical Exam: WD in NAD Neck  Supple NROM, without any enlargements.  Lymph Node Survey No cervical supraclavicular or inguinal adenopathy Cardiovascular  Pulse normal rate, regularity and rhythm. S1 and S2 normal.  Lungs  Clear to auscultation bilateraly, without wheezes/crackles/rhonchi. Good air movement.  Skin  No rash/lesions/breakdown  Psychiatry  Alert and oriented to person, place, and time  Abdomen  Normoactive bowel sounds, abdomen soft, non-tender and obese without evidence of hernia.  Back No CVA tenderness Genito Urinary  Vulva/vagina: Normal external female genitalia.   No lesions. No discharge or bleeding.  Bladder/urethra:  No lesions or masses, well supported bladder  Vagina: narrow (particularly upper vagina). No lesions.  Cervix: Normal appearing, no lesions.  Uterus:  Small, mobile, no parametrial involvement or nodularity.  Adnexa: no masses. Rectal  deferred Extremities  No bilateral cyanosis, clubbing or edema.   Thereasa Solo, MD  11/20/2018, 5:11 PM

## 2018-11-20 NOTE — Progress Notes (Signed)
Consult Note: Gyn-Onc  Consult was requested by Dr. Nori Riis for the evaluation of Ashley Moon 58 y.o. female  CC:  Chief Complaint  Patient presents with  . complex endometrial hyperplasia    Assessment/Plan:  Ashley Moon  is a 58 y.o.  year old with complex atypical hyperplasia of the uterus.  She is obese.  She is virginal and nulliparous with a narrow vagina.  I discussed the etiology of this disease, which in her case is likely unopposed estrogen from obesity.  I explained the natural history of complex atypical hyperplasia including a 30% risk for progression to malignancy.  I discussed that there is a 40% likelihood that there is occult malignancy that exists at present.  I discussed treatment options which include hormonal modulation with progestin therapy, in addition to definitive surgical management with hysterectomy BSO and sentinel lymph node biopsy.  The patient is electing for definitive surgical procedure.  I discussed operative risks including  bleeding, infection, damage to internal organs (such as bladder,ureters, bowels), blood clot, reoperation and rehospitalization. Discussed anticipated hospital stay.  She has a narrow vagina, a slightly greater than normal sized uterus.  Therefore there is a possibility that she required mini laparotomy for specimen delivery.   HPI: Ashley Moon is a 58 year old P0 who is seen in consultation at the request of Dr Nori Riis for complex atypical hyperplasia.  The patient reports that she became menopausal in approximately 2017.  In September 2019 she began having intermittent irregular vaginal bleeding that she felt was triggered by heavy lifting.  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  When the bleeding persisted she was evaluated by Dr. Milta Deiters.  He performed a Pap smear in November 2019 which was unremarkable.  He also performed a transvaginal ultrasound on September 09, 2018 which revealed a slightly enlarged uterus  measuring 8.5 x 6.2 x 7.2 cm with an endometrial thickness of 2.32 cm.  The left ovary was not visualized, however no adnexal masses was seen.  No right adnexal masses were seen either.  She was taken to the operating room for a surgical procedure with hysteroscopy on October 26, 2018 and sampling of an endometrial polyp took place which confirmed an endometrium with complex atypical hyperplasia.  There was also removal of submucosal benign leiomyomas.  The patient is obese, weighs 200 pounds and is 76foot 2 inches tall.  She has had a prior abdominal myomectomy with ovarian cyst removal approximately 2011.  She is also had as a separate procedure an open appendectomy.  Her sister has a history of uterine cancer.  The patient is not treated for diabetes.   Current Meds:  Outpatient Encounter Medications as of 11/20/2018  Medication Sig  . acetaminophen (TYLENOL) 500 MG tablet Take 500 mg by mouth every 6 (six) hours as needed.  . diphenhydrAMINE (BENADRYL) 25 mg capsule Take 25 mg by mouth every 6 (six) hours as needed.  Marland Kitchen acetaminophen-codeine (TYLENOL #3) 300-30 MG tablet TAKE 1 OR 2 TABLETS BY MOUTH EVERY 4 HOURS AS NEEDED FOR POST OP PAIN  . progesterone (PROMETRIUM) 100 MG capsule Take 100 mg by mouth at bedtime.  . [DISCONTINUED] fexofenadine (ALLEGRA) 180 MG tablet Take 180 mg by mouth daily.   Facility-Administered Encounter Medications as of 11/20/2018  Medication  . 0.9 %  sodium chloride infusion    Allergy:  Allergies  Allergen Reactions  . Adhesive [Tape] Itching    Itching, rash  . Erythromycin  Diarrhea  . Neosporin [Neomycin-Bacitracin Zn-Polymyx] Rash  . Sulfa Antibiotics Rash    Social Hx:   Social History   Socioeconomic History  . Marital status: Single    Spouse name: Not on file  . Number of children: Not on file  . Years of education: Not on file  . Highest education level: Not on file  Occupational History  . Not on file  Social Needs  . Financial  resource strain: Not on file  . Food insecurity:    Worry: Not on file    Inability: Not on file  . Transportation needs:    Medical: Not on file    Non-medical: Not on file  Tobacco Use  . Smoking status: Former Smoker    Last attempt to quit: 10/29/2003    Years since quitting: 15.0  . Smokeless tobacco: Never Used  Substance and Sexual Activity  . Alcohol use: No  . Drug use: No  . Sexual activity: Not on file  Lifestyle  . Physical activity:    Days per week: Not on file    Minutes per session: Not on file  . Stress: Not on file  Relationships  . Social connections:    Talks on phone: Not on file    Gets together: Not on file    Attends religious service: Not on file    Active member of club or organization: Not on file    Attends meetings of clubs or organizations: Not on file    Relationship status: Not on file  . Intimate partner violence:    Fear of current or ex partner: Not on file    Emotionally abused: Not on file    Physically abused: Not on file    Forced sexual activity: Not on file  Other Topics Concern  . Not on file  Social History Narrative  . Not on file    Past Surgical Hx:  Past Surgical History:  Procedure Laterality Date  . APPENDECTOMY  1983  . COLONOSCOPY    . INGUINAL HERNIA REPAIR Right 1996  . MYOMECTOMY  2005   rib injury occurred at this time; pt denies  'myomectomy '  . OVARIAN CYST REMOVAL  2010  . SALIVARY GLAND SURGERY Left 1998    Past Medical Hx:  Past Medical History:  Diagnosis Date  . Asthma   . Personal history of colonic polyps - adenomas 11/29/2013    Past Gynecological History:  G0, virginal. No LMP recorded. (Menstrual status: Perimenopausal).  Family Hx:  Family History  Problem Relation Age of Onset  . Kidney disease Father   . Diabetes Father   . Inflammatory bowel disease Father   . Stroke Father   . Uterine cancer Sister   . Colon cancer Neg Hx     Review of Systems:  Constitutional  Feels well,     ENT Normal appearing ears and nares bilaterally Skin/Breast  No rash, sores, jaundice, itching, dryness Cardiovascular  No chest pain, shortness of breath, or edema  Pulmonary  No cough or wheeze.  Gastro Intestinal  No nausea, vomitting, or diarrhoea. No bright red blood per rectum, no abdominal pain, change in bowel movement, or constipation.  Genito Urinary  No frequency, urgency, dysuria, + postmenopausal bleeding. Musculo Skeletal  No myalgia, arthralgia, joint swelling or pain  Neurologic  No weakness, numbness, change in gait,  Psychology  No depression, anxiety, insomnia.   Vitals:  Blood pressure (!) 150/74, pulse 72, temperature 98.6 F (37 C),  temperature source Oral, resp. rate 20, height 5\' 2"  (1.575 m), weight 200 lb 1.9 oz (90.8 kg), SpO2 97 %.  Physical Exam: WD in NAD Neck  Supple NROM, without any enlargements.  Lymph Node Survey No cervical supraclavicular or inguinal adenopathy Cardiovascular  Pulse normal rate, regularity and rhythm. S1 and S2 normal.  Lungs  Clear to auscultation bilateraly, without wheezes/crackles/rhonchi. Good air movement.  Skin  No rash/lesions/breakdown  Psychiatry  Alert and oriented to person, place, and time  Abdomen  Normoactive bowel sounds, abdomen soft, non-tender and obese without evidence of hernia.  Back No CVA tenderness Genito Urinary  Vulva/vagina: Normal external female genitalia.   No lesions. No discharge or bleeding.  Bladder/urethra:  No lesions or masses, well supported bladder  Vagina: narrow (particularly upper vagina). No lesions.  Cervix: Normal appearing, no lesions.  Uterus:  Small, mobile, no parametrial involvement or nodularity.  Adnexa: no masses. Rectal  deferred Extremities  No bilateral cyanosis, clubbing or edema.   Thereasa Solo, MD  11/20/2018, 5:11 PM

## 2018-11-25 ENCOUNTER — Telehealth: Payer: Self-pay | Admitting: *Deleted

## 2018-11-25 NOTE — Telephone Encounter (Signed)
Received the authorization from the patient's insurance for the surgery. Fax the authorization form to [facility]

## 2018-11-30 NOTE — Patient Instructions (Signed)
Ashley Moon  11/30/2018   Your procedure is scheduled on: 12-08-2018    Report to [facility] Main  Entrance     Report to admitting at 8:00AM    Call this number if you have problems the morning of surgery [phone]     Remember: Eat a light diet the day before surgery.  Examples including soups, broths, toast, yogurt, mashed potatoes.  Things to avoid include carbonated beverages (fizzy beverages), raw fruits and raw vegetables, or beans. If your bowels are filled with gas, your surgeon will have difficulty visualizing your pelvic organs which increases your surgical risks.    NO SOLID FOOD AFTER MIDNIGHT THE NIGHT PRIOR TO SURGERY. NOTHING BY MOUTH EXCEPT CLEAR LIQUIDS UNTIL 3 HOURS PRIOR TO [place] SURGERY. PLEASE FINISH ENSURE DRINK PER SURGEON ORDER 3 HOURS PRIOR TO SCHEDULED SURGERY TIME WHICH NEEDS TO BE COMPLETED AT _____7:00AM_______.  BRUSH YOUR TEETH MORNING OF SURGERY AND RINSE YOUR MOUTH OUT, NO CHEWING GUM CANDY OR MINTS.      CLEAR LIQUID DIET   Foods Allowed                                                                     Foods Excluded  Coffee and tea, regular and decaf                             liquids that you cannot  Plain Jell-O in any flavor                                             see through such as: Fruit ices (not with fruit pulp)                                     milk, soups, orange juice  Iced Popsicles                                    All solid food Carbonated beverages, regular and diet                                    Cranberry, grape and apple juices Sports drinks like Gatorade Lightly seasoned clear broth or consume(fat free) Sugar, honey syrup  Sample Menu Breakfast                                Lunch                                     Supper Cranberry juice  Beef broth                            Chicken broth Jell-O                                     Grape juice                            Apple juice Coffee or tea                        Jell-O                                      Popsicle                                                Coffee or tea                        Coffee or tea  _____________________________________________________________________       Take these medicines the morning of surgery with A SIP OF WATER: NONE                                You may not have any metal on your body including hair pins and              piercings  Do not wear jewelry, make-up, lotions, powders or perfumes, deodorant             Do not wear nail polish.  Do not shave  48 hours prior to surgery.               Do not bring valuables to the hospital. [place].  Contacts, dentures or bridgework may not be worn into surgery.  Leave suitcase in the car. After surgery it may be brought to your room.     Patients discharged the day of surgery will not be allowed to drive home. IF YOU ARE HAVING SURGERY AND GOING HOME THE SAME DAY, YOU MUST HAVE AN ADULT TO DRIVE YOU HOME AND BE WITH YOU FOR 24 HOURS. YOU MAY GO HOME BY TAXI OR UBER OR ORTHERWISE, BUT AN ADULT MUST ACCOMPANY YOU HOME AND STAY WITH YOU FOR 24 HOURS.  Name and phone number of your driver:  Special Instructions: N/A              Please read over the following fact sheets you were given: _____________________________________________________________________             [facility] - Preparing for Surgery Before surgery, you can play an important role.  Because skin is not sterile, your skin needs to be as free of germs as possible.  You can reduce the number of germs on your skin by washing with CHG (chlorahexidine gluconate) soap before surgery.  CHG is an antiseptic cleaner which kills  germs and bonds with the skin to continue killing germs even after washing. Please DO NOT use if you have an allergy to CHG or antibacterial soaps.  If your skin becomes  reddened/irritated stop using the CHG and inform your nurse when you arrive at Short Stay. Do not shave (including legs and underarms) for at least 48 hours prior to the first CHG shower.  You may shave your face/neck. Please follow these instructions carefully:  1.  Shower with CHG Soap the night before surgery and the  morning of Surgery.  2.  If you choose to wash your hair, wash your hair first as usual with your  normal  shampoo.  3.  After you shampoo, rinse your hair and body thoroughly to remove the  shampoo.                           4.  Use CHG as you would any other liquid soap.  You can apply chg directly  to the skin and wash                       Gently with a scrungie or clean washcloth.  5.  Apply the CHG Soap to your body ONLY FROM THE NECK DOWN.   Do not use on face/ open                           Wound or open sores. Avoid contact with eyes, ears mouth and genitals (private parts).                       Wash face,  Genitals (private parts) with your normal soap.             6.  Wash thoroughly, paying special attention to the area where your surgery  will be performed.  7.  Thoroughly rinse your body with warm water from the neck down.  8.  DO NOT shower/wash with your normal soap after using and rinsing off  the CHG Soap.                9.  Pat yourself dry with a clean towel.            10.  Wear clean pajamas.            11.  Place clean sheets on your bed the night of your first shower and do not  sleep with pets. Day of Surgery : Do not apply any lotions/deodorants the morning of surgery.  Please wear clean clothes to the hospital/surgery center.  FAILURE TO FOLLOW THESE INSTRUCTIONS MAY RESULT IN THE CANCELLATION OF YOUR SURGERY PATIENT SIGNATURE_________________________________  NURSE SIGNATURE__________________________________  ________________________________________________________________________   Adam Phenix  An incentive spirometer is a tool that  can help keep your lungs clear and active. This tool measures how well you are filling your lungs with each breath. Taking long deep breaths may help reverse or decrease the chance of developing breathing (pulmonary) problems (especially infection) following:  A long period of time when you are unable to move or be active. BEFORE THE PROCEDURE   If the spirometer includes an indicator to show your best effort, your nurse or respiratory therapist will set it to a desired goal.  If possible, sit up straight or lean slightly forward. Try not to slouch.  Hold the incentive spirometer in an  upright position. INSTRUCTIONS FOR USE  1. Sit on the edge of your bed if possible, or sit up as far as you can in bed or on a chair. 2. Hold the incentive spirometer in an upright position. 3. Breathe out normally. 4. Place the mouthpiece in your mouth and seal your lips tightly around it. 5. Breathe in slowly and as deeply as possible, raising the piston or the ball toward the top of the column. 6. Hold your breath for 3-5 seconds or for as long as possible. Allow the piston or ball to fall to the bottom of the column. 7. Remove the mouthpiece from your mouth and breathe out normally. 8. Rest for a few seconds and repeat Steps 1 through 7 at least 10 times every 1-2 hours when you are awake. Take your time and take a few normal breaths between deep breaths. 9. The spirometer may include an indicator to show your best effort. Use the indicator as a goal to work toward during each repetition. 10. After each set of 10 deep breaths, practice coughing to be sure your lungs are clear. If you have an incision (the cut made at the time of surgery), support your incision when coughing by placing a pillow or rolled up towels firmly against it. Once you are able to get out of bed, walk around indoors and cough well. You may stop using the incentive spirometer when instructed by your caregiver.  RISKS AND  COMPLICATIONS  Take your time so you do not get dizzy or light-headed.  If you are in pain, you may need to take or ask for pain medication before doing incentive spirometry. It is harder to take a deep breath if you are having pain. AFTER USE  Rest and breathe slowly and easily.  It can be helpful to keep track of a log of your progress. Your caregiver can provide you with a simple table to help with this. If you are using the spirometer at home, follow these instructions: [place] IF:   You are having difficultly using the spirometer.  You have trouble using the spirometer as often as instructed.  Your pain medication is not giving enough relief while using the spirometer.  You develop fever of 100.5 F (38.1 C) or higher. SEEK IMMEDIATE MEDICAL CARE IF:   You cough up bloody sputum that had not been present before.  You develop fever of 102 F (38.9 C) or greater.  You develop worsening pain at or near the incision site. MAKE SURE YOU:   Understand these instructions.  Will watch your condition.  Will get help right away if you are not doing well or get worse. Document Released: 02/24/2007 Document Revised: 01/06/2012 Document Reviewed: 04/27/2007 Exit[employer] Patient Information 2014 Exit[employer], [place].   ________________________________________________________________________  WHAT IS A BLOOD TRANSFUSION? Blood Transfusion Information  A transfusion is the replacement of blood or some of its parts. Blood is made up of multiple cells which provide different functions.  Red blood cells carry oxygen and are used for blood loss replacement.  White blood cells fight against infection.  Platelets control bleeding.  Plasma helps clot blood.  Other blood products are available for specialized needs, such as hemophilia or other clotting disorders. BEFORE THE TRANSFUSION  Who gives blood for transfusions?   Healthy volunteers who are fully evaluated to make sure  their blood is safe. This is blood bank blood. Transfusion therapy is the safest it has ever been in the practice of medicine.  Before blood is taken from a donor, a complete history is taken to make sure that person has no history of diseases nor engages in risky social behavior (examples are intravenous drug use or sexual activity with multiple partners). The donor's travel history is screened to minimize risk of transmitting infections, such as malaria. The donated blood is tested for signs of infectious diseases, such as HIV and hepatitis. The blood is then tested to be sure it is compatible with you in order to minimize the chance of a transfusion reaction. If you or a relative donates blood, this is often done in anticipation of surgery and is not appropriate for emergency situations. It takes many days to process the donated blood. RISKS AND COMPLICATIONS Although transfusion therapy is very safe and saves many lives, the main dangers of transfusion include:   Getting an infectious disease.  Developing a transfusion reaction. This is an allergic reaction to something in the blood you were given. Every precaution is taken to prevent this. The decision to have a blood transfusion has been considered carefully by your caregiver before blood is given. Blood is not given unless the benefits outweigh the risks. AFTER THE TRANSFUSION  Right after receiving a blood transfusion, you will usually feel much better and more energetic. This is especially true if your red blood cells have gotten low (anemic). The transfusion raises the level of the red blood cells which carry oxygen, and this usually causes an energy increase.  The nurse administering the transfusion will monitor you carefully for complications. HOME CARE INSTRUCTIONS  No special instructions are needed after a transfusion. You may find your energy is better. Speak with your caregiver about any limitations on activity for underlying diseases  you may have. SEEK MEDICAL CARE IF:   Your condition is not improving after your transfusion.  You develop redness or irritation at the intravenous (IV) site. SEEK IMMEDIATE MEDICAL CARE IF:  Any of the following symptoms occur over the next 12 hours:  Shaking chills.  You have a temperature by mouth above 102 F (38.9 C), not controlled by medicine.  Chest, back, or muscle pain.  People around you feel you are not acting correctly or are confused.  Shortness of breath or difficulty breathing.  Dizziness and fainting.  You get a rash or develop hives.  You have a decrease in urine output.  Your urine turns a dark color or changes to pink, red, or brown. Any of the following symptoms occur over the next 10 days:  You have a temperature by mouth above 102 F (38.9 C), not controlled by medicine.  Shortness of breath.  Weakness after normal activity.  The white part of the eye turns yellow (jaundice).  You have a decrease in the amount of urine or are urinating less often.  Your urine turns a dark color or changes to pink, red, or brown. Document Released: 10/11/2000 Document Revised: 01/06/2012 Document Reviewed: 05/30/2008 [facility] Patient Information 2014 [place], [place].  _______________________________________________________________________

## 2018-12-01 ENCOUNTER — Encounter (HOSPITAL_COMMUNITY): Payer: Self-pay

## 2018-12-01 ENCOUNTER — Encounter (HOSPITAL_COMMUNITY)
Admission: RE | Admit: 2018-12-01 | Discharge: 2018-12-01 | Disposition: A | Discharge status: Home / Self Care | Payer: 59 | Source: Ambulatory Visit | Attending: Gynecologic Oncology | Admitting: Gynecologic Oncology

## 2018-12-01 ENCOUNTER — Other Ambulatory Visit: Payer: Self-pay

## 2018-12-01 DIAGNOSIS — Z01812 Encounter for preprocedural laboratory examination: Secondary | ICD-10-CM | POA: Diagnosis present

## 2018-12-01 HISTORY — DX: Chronic kidney disease, unspecified: N18.9

## 2018-12-01 HISTORY — DX: Endometrial intraepithelial neoplasia (EIN): N85.02

## 2018-12-01 HISTORY — DX: Elevated blood-pressure reading, without diagnosis of hypertension: R03.0

## 2018-12-01 HISTORY — DX: Other complications of anesthesia, initial encounter: T88.59XA

## 2018-12-01 HISTORY — DX: Anemia, unspecified: D64.9

## 2018-12-01 HISTORY — DX: Personal history of urinary calculi: Z87.442

## 2018-12-01 HISTORY — DX: Adverse effect of unspecified anesthetic, initial encounter: T41.45XA

## 2018-12-01 LAB — URINALYSIS, ROUTINE W REFLEX MICROSCOPIC
Bilirubin Urine: NEGATIVE
Glucose, UA: NEGATIVE mg/dL
Ketones, ur: NEGATIVE mg/dL
Leukocytes, UA: NEGATIVE
Nitrite: NEGATIVE
PROTEIN: NEGATIVE mg/dL
Specific Gravity, Urine: 1.012 (ref 1.005–1.030)
pH: 6 (ref 5.0–8.0)

## 2018-12-01 LAB — COMPREHENSIVE METABOLIC PANEL
ALT: 33 U/L (ref 0–44)
AST: 25 U/L (ref 15–41)
Albumin: 4.4 g/dL (ref 3.5–5.0)
Alkaline Phosphatase: 91 U/L (ref 38–126)
Anion gap: 8 (ref 5–15)
BUN: 14 mg/dL (ref 6–20)
CO2: 28 mmol/L (ref 22–32)
Calcium: 8.7 mg/dL — ABNORMAL LOW (ref 8.9–10.3)
Chloride: 104 mmol/L (ref 98–111)
Creatinine, Ser: 0.66 mg/dL (ref 0.44–1.00)
GFR calc non Af Amer: >60 mL/min (ref >60)
Glucose, Bld: 100 mg/dL — ABNORMAL HIGH (ref 70–99)
Potassium: 4.1 mmol/L (ref 3.5–5.1)
Sodium: 140 mmol/L (ref 135–145)
Total Bilirubin: 0.4 mg/dL (ref 0.3–1.2)
Total Protein: 8.1 g/dL (ref 6.5–8.1)

## 2018-12-01 LAB — CBC
HCT: 48.1 % — ABNORMAL HIGH (ref 36.0–46.0)
Hemoglobin: 15.7 g/dL — ABNORMAL HIGH (ref 12.0–15.0)
MCH: 30.3 pg (ref 26.0–34.0)
MCHC: 32.6 g/dL (ref 30.0–36.0)
MCV: 92.9 fL (ref 80.0–100.0)
Platelets: 278 K/uL (ref 150–400)
RBC: 5.18 MIL/uL — ABNORMAL HIGH (ref 3.87–5.11)
RDW: 13.1 % (ref 11.5–15.5)
WBC: 8 K/uL (ref 4.0–10.5)
nRBC: 0 % (ref 0.0–0.2)

## 2018-12-01 LAB — ABO/RH: ABO/RH(D): O POS

## 2018-12-08 ENCOUNTER — Ambulatory Visit (HOSPITAL_COMMUNITY): Payer: 59 | Admitting: Physician Assistant

## 2018-12-08 ENCOUNTER — Ambulatory Visit: Admit: 2018-12-08 | Payer: 59 | Admitting: Gynecologic Oncology

## 2018-12-08 ENCOUNTER — Encounter (HOSPITAL_COMMUNITY)
Admission: RE | Disposition: A | Discharge status: Home / Self Care | Payer: Self-pay | Source: Ambulatory Visit | Attending: Gynecologic Oncology

## 2018-12-08 ENCOUNTER — Ambulatory Visit (HOSPITAL_COMMUNITY): Payer: 59 | Admitting: Anesthesiology

## 2018-12-08 ENCOUNTER — Ambulatory Visit (HOSPITAL_COMMUNITY)
Admission: RE | Admit: 2018-12-08 | Discharge: 2018-12-08 | Disposition: A | Discharge status: Home / Self Care | Payer: 59 | Source: Ambulatory Visit | Attending: Gynecologic Oncology | Admitting: Gynecologic Oncology

## 2018-12-08 ENCOUNTER — Encounter (HOSPITAL_COMMUNITY): Payer: Self-pay | Admitting: Gynecologic Oncology

## 2018-12-08 ENCOUNTER — Other Ambulatory Visit: Payer: Self-pay

## 2018-12-08 ENCOUNTER — Ambulatory Visit (HOSPITAL_COMMUNITY): Payer: 59 | Admitting: Certified Registered Nurse Anesthetist

## 2018-12-08 DIAGNOSIS — Y763 Surgical instruments, materials and obstetric and gynecological devices (including sutures) associated with adverse incidents: Secondary | ICD-10-CM | POA: Diagnosis not present

## 2018-12-08 DIAGNOSIS — N9489 Other specified conditions associated with female genital organs and menstrual cycle: Secondary | ICD-10-CM | POA: Insufficient documentation

## 2018-12-08 DIAGNOSIS — Z90710 Acquired absence of both cervix and uterus: Secondary | ICD-10-CM | POA: Diagnosis not present

## 2018-12-08 DIAGNOSIS — D251 Intramural leiomyoma of uterus: Secondary | ICD-10-CM | POA: Diagnosis not present

## 2018-12-08 DIAGNOSIS — E669 Obesity, unspecified: Secondary | ICD-10-CM | POA: Insufficient documentation

## 2018-12-08 DIAGNOSIS — Y838 Other surgical procedures as the cause of abnormal reaction of the patient, or of later complication, without mention of misadventure at the time of the procedure: Secondary | ICD-10-CM | POA: Diagnosis not present

## 2018-12-08 DIAGNOSIS — N939 Abnormal uterine and vaginal bleeding, unspecified: Secondary | ICD-10-CM | POA: Insufficient documentation

## 2018-12-08 DIAGNOSIS — Z888 Allergy status to other drugs, medicaments and biological substances status: Secondary | ICD-10-CM | POA: Diagnosis not present

## 2018-12-08 DIAGNOSIS — Z8049 Family history of malignant neoplasm of other genital organs: Secondary | ICD-10-CM | POA: Insufficient documentation

## 2018-12-08 DIAGNOSIS — N800 Endometriosis of uterus: Secondary | ICD-10-CM | POA: Insufficient documentation

## 2018-12-08 DIAGNOSIS — J45909 Unspecified asthma, uncomplicated: Secondary | ICD-10-CM | POA: Diagnosis not present

## 2018-12-08 DIAGNOSIS — S3141XA Laceration without foreign body of vagina and vulva, initial encounter: Secondary | ICD-10-CM | POA: Diagnosis not present

## 2018-12-08 DIAGNOSIS — N736 Female pelvic peritoneal adhesions (postinfective): Secondary | ICD-10-CM | POA: Diagnosis not present

## 2018-12-08 DIAGNOSIS — N8502 Endometrial intraepithelial neoplasia [EIN]: Secondary | ICD-10-CM

## 2018-12-08 DIAGNOSIS — Z87891 Personal history of nicotine dependence: Secondary | ICD-10-CM | POA: Insufficient documentation

## 2018-12-08 DIAGNOSIS — E119 Type 2 diabetes mellitus without complications: Secondary | ICD-10-CM | POA: Diagnosis not present

## 2018-12-08 DIAGNOSIS — D282 Benign neoplasm of uterine tubes and ligaments: Secondary | ICD-10-CM | POA: Insufficient documentation

## 2018-12-08 DIAGNOSIS — Z6836 Body mass index (BMI) 36.0-36.9, adult: Secondary | ICD-10-CM | POA: Insufficient documentation

## 2018-12-08 DIAGNOSIS — Y92230 Patient room in hospital as the place of occurrence of the external cause: Secondary | ICD-10-CM | POA: Insufficient documentation

## 2018-12-08 DIAGNOSIS — Z882 Allergy status to sulfonamides status: Secondary | ICD-10-CM | POA: Insufficient documentation

## 2018-12-08 HISTORY — PX: SENTINEL NODE BIOPSY: SHX6608

## 2018-12-08 HISTORY — PX: ROBOTIC ASSISTED TOTAL HYSTERECTOMY WITH BILATERAL SALPINGO OOPHERECTOMY: SHX6086

## 2018-12-08 HISTORY — PX: DILATION AND CURETTAGE OF UTERUS: SHX78

## 2018-12-08 LAB — TYPE AND SCREEN
ABO/RH(D): O POS
ANTIBODY SCREEN: NEGATIVE

## 2018-12-08 SURGERY — DILATION AND CURETTAGE
Anesthesia: General

## 2018-12-08 SURGERY — HYSTERECTOMY, TOTAL, ROBOT-ASSISTED, LAPAROSCOPIC, WITH BILATERAL SALPINGO-OOPHORECTOMY
Anesthesia: General

## 2018-12-08 MED ORDER — ROCURONIUM BROMIDE 100 MG/10ML IV SOLN
INTRAVENOUS | Status: AC
  Filled 2018-12-08: qty 1

## 2018-12-08 MED ORDER — OXYCODONE HCL 5 MG/5ML PO SOLN
5.0000 mg | Freq: Once | ORAL | Status: DC | PRN
  Filled 2018-12-08: qty 5

## 2018-12-08 MED ORDER — LIDOCAINE 2% (20 MG/ML) 5 ML SYRINGE
INTRAMUSCULAR | Status: DC | PRN
  Administered 2018-12-08: 80 mg via INTRAVENOUS

## 2018-12-08 MED ORDER — OXYCODONE-ACETAMINOPHEN 5-325 MG PO TABS: 1.0000 | ORAL_TABLET | ORAL | 0 refills | Status: DC | PRN

## 2018-12-08 MED ORDER — OXYCODONE HCL 5 MG PO TABS
5.0000 mg | ORAL_TABLET | ORAL | Status: DC | PRN
  Administered 2018-12-08: 5 mg via ORAL

## 2018-12-08 MED ORDER — SODIUM CHLORIDE 0.9% FLUSH: 3.0000 mL | Freq: Two times a day (BID) | INTRAVENOUS | Status: DC

## 2018-12-08 MED ORDER — CEFAZOLIN SODIUM-DEXTROSE 2-4 GM/100ML-% IV SOLN
2.0000 g | INTRAVENOUS | Status: AC
  Administered 2018-12-08: 2 g via INTRAVENOUS
  Filled 2018-12-08: qty 100

## 2018-12-08 MED ORDER — MORPHINE SULFATE (PF) 4 MG/ML IV SOLN: 2.0000 mg | INTRAVENOUS | Status: DC | PRN

## 2018-12-08 MED ORDER — EPHEDRINE SULFATE-NACL 50-0.9 MG/10ML-% IV SOSY
PREFILLED_SYRINGE | INTRAVENOUS | Status: DC | PRN
  Administered 2018-12-08: 5 mg via INTRAVENOUS

## 2018-12-08 MED ORDER — FENTANYL CITRATE (PF) 100 MCG/2ML IJ SOLN
25.0000 ug | INTRAMUSCULAR | Status: DC | PRN
  Administered 2018-12-08 (×2): 50 ug via INTRAVENOUS

## 2018-12-08 MED ORDER — OXYCODONE HCL 5 MG PO TABS: 5.0000 mg | ORAL_TABLET | ORAL | Status: DC | PRN

## 2018-12-08 MED ORDER — MIDAZOLAM HCL 2 MG/2ML IJ SOLN
INTRAMUSCULAR | Status: AC
  Filled 2018-12-08: qty 2

## 2018-12-08 MED ORDER — STERILE WATER FOR INJECTION IJ SOLN
INTRAMUSCULAR | Status: AC
  Filled 2018-12-08: qty 10

## 2018-12-08 MED ORDER — EPHEDRINE 5 MG/ML INJ
INTRAVENOUS | Status: AC
  Filled 2018-12-08: qty 10

## 2018-12-08 MED ORDER — LIDOCAINE 2% (20 MG/ML) 5 ML SYRINGE
INTRAMUSCULAR | Status: DC | PRN
  Administered 2018-12-08: 1.5 mg/kg/h via INTRAVENOUS

## 2018-12-08 MED ORDER — PROPOFOL 10 MG/ML IV BOLUS
INTRAVENOUS | Status: AC
  Filled 2018-12-08: qty 20

## 2018-12-08 MED ORDER — FENTANYL CITRATE (PF) 250 MCG/5ML IJ SOLN
INTRAMUSCULAR | Status: AC
  Filled 2018-12-08: qty 5

## 2018-12-08 MED ORDER — FENTANYL CITRATE (PF) 100 MCG/2ML IJ SOLN
INTRAMUSCULAR | Status: AC
  Filled 2018-12-08: qty 2

## 2018-12-08 MED ORDER — ACETAMINOPHEN 650 MG RE SUPP
650.0000 mg | RECTAL | Status: DC | PRN
  Filled 2018-12-08: qty 1

## 2018-12-08 MED ORDER — PROPOFOL 10 MG/ML IV BOLUS
INTRAVENOUS | Status: DC | PRN
  Administered 2018-12-08: 200 mg via INTRAVENOUS

## 2018-12-08 MED ORDER — SUCCINYLCHOLINE CHLORIDE 200 MG/10ML IV SOSY
PREFILLED_SYRINGE | INTRAVENOUS | Status: DC | PRN
  Administered 2018-12-08: 100 mg via INTRAVENOUS

## 2018-12-08 MED ORDER — KETAMINE HCL 10 MG/ML IJ SOLN
INTRAMUSCULAR | Status: DC | PRN
  Administered 2018-12-08: 50 mg via INTRAVENOUS

## 2018-12-08 MED ORDER — STERILE WATER FOR INJECTION IJ SOLN
INTRAMUSCULAR | Status: DC | PRN
  Administered 2018-12-08: 10 mL

## 2018-12-08 MED ORDER — STERILE WATER FOR IRRIGATION IR SOLN
Status: DC | PRN
  Administered 2018-12-08: 1000 mL

## 2018-12-08 MED ORDER — SUGAMMADEX SODIUM 200 MG/2ML IV SOLN
INTRAVENOUS | Status: DC | PRN
  Administered 2018-12-08: 200 mg via INTRAVENOUS

## 2018-12-08 MED ORDER — SUGAMMADEX SODIUM 200 MG/2ML IV SOLN
INTRAVENOUS | Status: DC | PRN
  Administered 2018-12-08: 180 mg via INTRAVENOUS

## 2018-12-08 MED ORDER — LACTATED RINGERS IR SOLN
Status: DC | PRN
  Administered 2018-12-08: 1000 mL

## 2018-12-08 MED ORDER — ROCURONIUM BROMIDE 50 MG/5ML IV SOSY
PREFILLED_SYRINGE | INTRAVENOUS | Status: DC | PRN
  Administered 2018-12-08: 20 mg via INTRAVENOUS

## 2018-12-08 MED ORDER — LACTATED RINGERS IV SOLN
INTRAVENOUS | Status: DC
  Administered 2018-12-08 (×2): via INTRAVENOUS

## 2018-12-08 MED ORDER — SODIUM CHLORIDE 0.9% FLUSH: 3.0000 mL | INTRAVENOUS | Status: DC | PRN

## 2018-12-08 MED ORDER — ONDANSETRON HCL 4 MG/2ML IJ SOLN: 4.0000 mg | Freq: Four times a day (QID) | INTRAMUSCULAR | Status: DC | PRN

## 2018-12-08 MED ORDER — LIDOCAINE 2% (20 MG/ML) 5 ML SYRINGE
INTRAMUSCULAR | Status: AC
  Filled 2018-12-08: qty 5

## 2018-12-08 MED ORDER — FENTANYL CITRATE (PF) 250 MCG/5ML IJ SOLN
INTRAMUSCULAR | Status: DC | PRN
  Administered 2018-12-08: 100 ug via INTRAVENOUS

## 2018-12-08 MED ORDER — SUGAMMADEX SODIUM 200 MG/2ML IV SOLN
INTRAVENOUS | Status: AC
  Filled 2018-12-08: qty 2

## 2018-12-08 MED ORDER — ONDANSETRON HCL 4 MG/2ML IJ SOLN
INTRAMUSCULAR | Status: DC | PRN
  Administered 2018-12-08 (×2): 4 mg via INTRAVENOUS

## 2018-12-08 MED ORDER — DEXAMETHASONE SODIUM PHOSPHATE 4 MG/ML IJ SOLN: 4.0000 mg | INTRAMUSCULAR | Status: DC

## 2018-12-08 MED ORDER — SODIUM CHLORIDE 0.9 % IV SOLN: 250.0000 mL | INTRAVENOUS | Status: DC | PRN

## 2018-12-08 MED ORDER — OXYCODONE HCL 5 MG PO TABS: 5.0000 mg | ORAL_TABLET | Freq: Once | ORAL | Status: DC | PRN

## 2018-12-08 MED ORDER — ACETAMINOPHEN 325 MG PO TABS: 650.0000 mg | ORAL_TABLET | ORAL | Status: DC | PRN

## 2018-12-08 MED ORDER — BUPIVACAINE HCL (PF) 0.25 % IJ SOLN
INTRAMUSCULAR | Status: AC
  Filled 2018-12-08: qty 30

## 2018-12-08 MED ORDER — OXYCODONE HCL 5 MG PO TABS
ORAL_TABLET | ORAL | Status: AC
  Filled 2018-12-08: qty 1

## 2018-12-08 MED ORDER — SILVER NITRATE-POT NITRATE 75-25 % EX MISC
CUTANEOUS | Status: AC
  Filled 2018-12-08: qty 1

## 2018-12-08 MED ORDER — PROPOFOL 10 MG/ML IV BOLUS
INTRAVENOUS | Status: DC | PRN
  Administered 2018-12-08: 30 mg via INTRAVENOUS
  Administered 2018-12-08: 150 mg via INTRAVENOUS

## 2018-12-08 MED ORDER — ONDANSETRON HCL 4 MG/2ML IJ SOLN
INTRAMUSCULAR | Status: AC
  Filled 2018-12-08: qty 4

## 2018-12-08 MED ORDER — KETOROLAC TROMETHAMINE 30 MG/ML IJ SOLN
INTRAMUSCULAR | Status: DC | PRN
  Administered 2018-12-08: 30 mg via INTRAVENOUS

## 2018-12-08 MED ORDER — BUPIVACAINE HCL (PF) 0.25 % IJ SOLN
INTRAMUSCULAR | Status: DC | PRN
  Administered 2018-12-08: 22 mL

## 2018-12-08 MED ORDER — MIDAZOLAM HCL 5 MG/5ML IJ SOLN
INTRAMUSCULAR | Status: DC | PRN
  Administered 2018-12-08: 2 mg via INTRAVENOUS

## 2018-12-08 MED ORDER — FENTANYL CITRATE (PF) 100 MCG/2ML IJ SOLN
INTRAMUSCULAR | Status: DC | PRN
  Administered 2018-12-08 (×2): 50 ug via INTRAVENOUS

## 2018-12-08 MED ORDER — ACETAMINOPHEN 500 MG PO TABS
1000.0000 mg | ORAL_TABLET | ORAL | Status: AC
  Administered 2018-12-08: 1000 mg via ORAL
  Filled 2018-12-08: qty 2

## 2018-12-08 MED ORDER — PHENYLEPHRINE 40 MCG/ML (10ML) SYRINGE FOR IV PUSH (FOR BLOOD PRESSURE SUPPORT)
PREFILLED_SYRINGE | INTRAVENOUS | Status: DC | PRN
  Administered 2018-12-08 (×2): 80 ug via INTRAVENOUS
  Administered 2018-12-08: 120 ug via INTRAVENOUS
  Administered 2018-12-08: 80 ug via INTRAVENOUS

## 2018-12-08 MED ORDER — DEXAMETHASONE SODIUM PHOSPHATE 10 MG/ML IJ SOLN
INTRAMUSCULAR | Status: DC | PRN
  Administered 2018-12-08: 10 mg via INTRAVENOUS

## 2018-12-08 MED ORDER — INDOCYANINE GREEN 25 MG IV SOLR
INTRAVENOUS | Status: DC | PRN
  Administered 2018-12-08: 2.5 mg

## 2018-12-08 MED ORDER — ONDANSETRON HCL 4 MG/2ML IJ SOLN
INTRAMUSCULAR | Status: DC | PRN
  Administered 2018-12-08: 4 mg via INTRAVENOUS

## 2018-12-08 MED ORDER — SCOPOLAMINE 1 MG/3DAYS TD PT72
1.0000 | MEDICATED_PATCH | TRANSDERMAL | Status: DC
  Administered 2018-12-08: 1.5 mg via TRANSDERMAL
  Filled 2018-12-08: qty 1

## 2018-12-08 MED ORDER — ROCURONIUM BROMIDE 100 MG/10ML IV SOLN
INTRAVENOUS | Status: DC | PRN
  Administered 2018-12-08: 20 mg via INTRAVENOUS
  Administered 2018-12-08: 60 mg via INTRAVENOUS

## 2018-12-08 MED ORDER — FENTANYL CITRATE (PF) 100 MCG/2ML IJ SOLN
INTRAMUSCULAR | Status: AC
  Administered 2018-12-08: 50 ug via INTRAVENOUS
  Filled 2018-12-08: qty 4

## 2018-12-08 MED ORDER — FENTANYL CITRATE (PF) 100 MCG/2ML IJ SOLN: 25.0000 ug | INTRAMUSCULAR | Status: DC | PRN

## 2018-12-08 SURGICAL SUPPLY — 60 items
ADH SKN CLS APL DERMABOND .7 (GAUZE/BANDAGES/DRESSINGS) ×2
AGENT HMST KT MTR STRL THRMB (HEMOSTASIS)
APL ESCP 34 STRL LF DISP (HEMOSTASIS)
APPLICATOR SURGIFLO ENDO (HEMOSTASIS) IMPLANT
BAG LAPAROSCOPIC 12 15 PORT 16 (BASKET) IMPLANT
BAG RETRIEVAL 12/15 (BASKET)
BAG SPEC RTRVL LRG 6X4 10 (ENDOMECHANICALS)
BNDG GAUZE ELAST 4 BULKY (GAUZE/BANDAGES/DRESSINGS) ×2 IMPLANT
COVER BACK TABLE 60X90IN (DRAPES) ×3 IMPLANT
COVER TIP SHEARS 8 DVNC (MISCELLANEOUS) ×2 IMPLANT
COVER TIP SHEARS 8MM DA VINCI (MISCELLANEOUS) ×1
COVER WAND RF STERILE (DRAPES) IMPLANT
DECANTER SPIKE VIAL GLASS SM (MISCELLANEOUS) IMPLANT
DERMABOND ADVANCED (GAUZE/BANDAGES/DRESSINGS) ×1
DERMABOND ADVANCED .7 DNX12 (GAUZE/BANDAGES/DRESSINGS) ×2 IMPLANT
DRAPE ARM DVNC X/XI (DISPOSABLE) ×8 IMPLANT
DRAPE COLUMN DVNC XI (DISPOSABLE) ×2 IMPLANT
DRAPE DA VINCI XI ARM (DISPOSABLE) ×4
DRAPE DA VINCI XI COLUMN (DISPOSABLE) ×1
DRAPE SHEET LG 3/4 BI-LAMINATE (DRAPES) ×3 IMPLANT
DRAPE SURG IRRIG POUCH 19X23 (DRAPES) ×3 IMPLANT
ELECT REM PT RETURN 15FT ADLT (MISCELLANEOUS) ×3 IMPLANT
GAUZE SPONGE 4X4 16PLY XRAY LF (GAUZE/BANDAGES/DRESSINGS) ×1 IMPLANT
GLOVE BIO SURGEON STRL SZ 6 (GLOVE) ×12 IMPLANT
GLOVE BIO SURGEON STRL SZ 6.5 (GLOVE) ×2 IMPLANT
GOWN STRL REUS W/ TWL LRG LVL3 (GOWN DISPOSABLE) ×4 IMPLANT
GOWN STRL REUS W/TWL LRG LVL3 (GOWN DISPOSABLE) ×6
HOLDER FOLEY CATH W/STRAP (MISCELLANEOUS) ×3 IMPLANT
IRRIG SUCT STRYKERFLOW 2 WTIP (MISCELLANEOUS) ×3
IRRIGATION SUCT STRKRFLW 2 WTP (MISCELLANEOUS) ×2 IMPLANT
KIT PROCEDURE DA VINCI SI (MISCELLANEOUS) ×1
KIT PROCEDURE DVNC SI (MISCELLANEOUS) IMPLANT
MANIPULATOR UTERINE 4.5 ZUMI (MISCELLANEOUS) ×3 IMPLANT
NDL SPNL 18GX3.5 QUINCKE PK (NEEDLE) IMPLANT
NEEDLE HYPO 22GX1.5 SAFETY (NEEDLE) ×1 IMPLANT
NEEDLE SPNL 18GX3.5 QUINCKE PK (NEEDLE) ×3 IMPLANT
OBTURATOR OPTICAL STANDARD 8MM (TROCAR) ×1
OBTURATOR OPTICAL STND 8 DVNC (TROCAR) ×2
OBTURATOR OPTICALSTD 8 DVNC (TROCAR) ×2 IMPLANT
PACK ROBOT GYN CUSTOM WL (TRAY / TRAY PROCEDURE) ×3 IMPLANT
PAD POSITIONING PINK XL (MISCELLANEOUS) ×3 IMPLANT
PORT ACCESS TROCAR AIRSEAL 12 (TROCAR) ×2 IMPLANT
PORT ACCESS TROCAR AIRSEAL 5M (TROCAR) ×1
POUCH SPECIMEN RETRIEVAL 10MM (ENDOMECHANICALS) IMPLANT
SEAL CANN UNIV 5-8 DVNC XI (MISCELLANEOUS) ×8 IMPLANT
SEAL XI 5MM-8MM UNIVERSAL (MISCELLANEOUS) ×4
SET TRI-LUMEN FLTR TB AIRSEAL (TUBING) ×3 IMPLANT
SURGIFLO W/THROMBIN 8M KIT (HEMOSTASIS) IMPLANT
SUT VIC AB 0 CT1 27 (SUTURE) ×6
SUT VIC AB 0 CT1 27XBRD ANTBC (SUTURE) IMPLANT
SUT VIC AB 2-0 SH 27 (SUTURE) ×9
SUT VIC AB 2-0 SH 27X BRD (SUTURE) IMPLANT
SUT VICRYL 0 UR6 27IN ABS (SUTURE) ×2 IMPLANT
SYR 10ML LL (SYRINGE) ×1 IMPLANT
TOWEL OR NON WOVEN STRL DISP B (DISPOSABLE) ×3 IMPLANT
TRAP SPECIMEN MUCOUS 40CC (MISCELLANEOUS) IMPLANT
TRAY FOLEY MTR SLVR 16FR STAT (SET/KITS/TRAYS/PACK) ×4 IMPLANT
UNDERPAD 30X30 (UNDERPADS AND DIAPERS) ×3 IMPLANT
WATER STERILE IRR 1000ML POUR (IV SOLUTION) ×3 IMPLANT
YANKAUER SUCT BULB TIP 10FT TU (MISCELLANEOUS) ×1 IMPLANT

## 2018-12-08 SURGICAL SUPPLY — 23 items
CATH ROBINSON RED A/P 16FR (CATHETERS) ×1 IMPLANT
COVER WAND RF STERILE (DRAPES) IMPLANT
DRAPE SHEET LG 3/4 BI-LAMINATE (DRAPES) ×2 IMPLANT
DRAPE UNDERBUTTOCKS STRL (DRAPE) ×1 IMPLANT
DRSG TELFA 3X8 NADH (GAUZE/BANDAGES/DRESSINGS) ×2 IMPLANT
GAUZE 4X4 16PLY RFD (DISPOSABLE) ×2 IMPLANT
GLOVE BIO SURGEON STRL SZ 6 (GLOVE) ×4 IMPLANT
GOWN STRL REUS W/ TWL LRG LVL3 (GOWN DISPOSABLE) ×2 IMPLANT
GOWN STRL REUS W/TWL LRG LVL3 (GOWN DISPOSABLE) ×4
KIT BASIN OR (CUSTOM PROCEDURE TRAY) ×2 IMPLANT
NS IRRIG 1000ML POUR BTL (IV SOLUTION) ×2 IMPLANT
PACK LITHOTOMY IV (CUSTOM PROCEDURE TRAY) ×2 IMPLANT
PAD DRESSING TELFA 3X8 NADH (GAUZE/BANDAGES/DRESSINGS) ×1 IMPLANT
PAD OB MATERNITY 4.3X12.25 (PERSONAL CARE ITEMS) ×2 IMPLANT
SURGILUBE 2OZ TUBE FLIPTOP (MISCELLANEOUS) ×2 IMPLANT
SUT VIC AB 0 CT1 27 (SUTURE) ×2
SUT VIC AB 0 CT1 27XBRD ANTBC (SUTURE) ×1 IMPLANT
SUT VIC AB 2-0 CT1 27 (SUTURE) ×2
SUT VIC AB 2-0 CT1 27XBRD (SUTURE) IMPLANT
SUT VICRYL 0 UR6 27IN ABS (SUTURE) ×2 IMPLANT
TOWEL OR 17X26 10 PK STRL BLUE (TOWEL DISPOSABLE) ×2 IMPLANT
UNDERPAD 30X30 (UNDERPADS AND DIAPERS) ×3 IMPLANT
YANKAUER SUCT BULB TIP 10FT TU (MISCELLANEOUS) ×2 IMPLANT

## 2018-12-08 NOTE — Interval H&P Note (Signed)
History and Physical Interval Note:  12/08/2018 9:30 AM  Ashley Moon  has presented today for surgery, with the diagnosis of COMPLEX ATYPICAL HYPERPLASIA  The various methods of treatment have been discussed with the patient and family. After consideration of risks, benefits and other options for treatment, the patient has consented to  Procedure(s): XI ROBOTIC ASSISTED TOTAL HYSTERECTOMY WITH BILATERAL SALPINGO OOPHORECTOMY (Bilateral) SENTINEL NODE BIOPSY (N/A) as a surgical intervention .  The patient's history has been reviewed, patient examined, no change in status, stable for surgery.  I have reviewed the patient's chart and labs.  Questions were answered to the patient's satisfaction.     Thereasa Solo

## 2018-12-08 NOTE — Progress Notes (Signed)
Dr Denman George in to see the patient in phase 2 of PACU.  Vaginal packing was removed by Dr. Denman George.  Dr Denman George talked to patient about taking her back to surgery to repack her vagina and assess vaginal bleeding.  I mentioned to Dr Denman George that the patient consumed a few saltine crackers and a couple cups of soda.  Dr Marcie Bal was made aware at the bedside that the patient consumed crackers and soda by mouth as well.  The patient signed the surgical consent with her sister present.

## 2018-12-08 NOTE — Transfer of Care (Signed)
Immediate Anesthesia Transfer of Care Note  Patient: Ashley Moon  Procedure(s) Performed: XI ROBOTIC ASSISTED TOTAL HYSTERECTOMY WITH BILATERAL SALPINGO OOPHORECTOMY (Bilateral ) SENTINEL NODE BIOPSY (N/A )  Patient Location: PACU  Anesthesia Type:General  Level of Consciousness: awake, alert  and oriented  Airway & Oxygen Therapy: Patient Spontanous Breathing and Patient connected to face mask oxygen  Post-op Assessment: Report given to RN and Post -op Vital signs reviewed and stable  Post vital signs: Reviewed and stable  Last Vitals:  Vitals Value Taken Time  BP 161/77 12/08/2018  1:00 PM  Temp    Pulse 81 12/08/2018  1:04 PM  Resp 12 12/08/2018  1:04 PM  SpO2 100 % 12/08/2018  1:04 PM  Vitals shown include unvalidated device data.  Last Pain:  Vitals:   12/08/18 0807  TempSrc: Oral      Patients Stated Pain Goal: 4 (10/20/81 5003)  Complications: No apparent anesthesia complications

## 2018-12-08 NOTE — Discharge Instructions (Signed)
Laparoscopically Assisted Vaginal Hysterectomy, Care After °This sheet gives you information about how to care for yourself after your procedure. Your health care provider may also give you more specific instructions. If you have problems or questions, contact your health care provider. °What can I expect after the procedure? °After the procedure, it is common to have: °· Soreness and numbness in your incision areas. °· Abdominal pain. You will be given pain medicine to control it. °· Vaginal bleeding and discharge. You will need to use a sanitary napkin after this procedure. °· Sore throat from the breathing tube that was inserted during surgery. °Follow these instructions at home: °Medicines °· Take over-the-counter and prescription medicines only as told by your health care provider. °· Do not take aspirin or ibuprofen. These medicines can cause bleeding. °· Do not drive or use heavy machinery while taking prescription pain medicine. °· Do not drive for 24 hours if you were given a medicine to help you relax (sedative) during the procedure. °Incision care ° °· Follow instructions from your health care provider about how to take care of your incisions. Make sure you: °? Wash your hands with soap and water before you change your bandage (dressing). If soap and water are not available, use hand sanitizer. °? Change your dressing as told by your health care provider. °? Leave stitches (sutures), skin glue, or adhesive strips in place. These skin closures may need to stay in place for 2 weeks or longer. If adhesive strip edges start to loosen and curl up, you may trim the loose edges. Do not remove adhesive strips completely unless your health care provider tells you to do that. °· Check your incision area every day for signs of infection. Check for: °? Redness, swelling, or pain. °? Fluid or blood. °? Warmth. °? Pus or a bad smell. °Activity °· Get regular exercise as told by your health care provider. You may be  told to take short walks every day and go farther each time. °· Return to your normal activities as told by your health care provider. Ask your health care provider what activities are safe for you. °· Do not douche, use tampons, or have sexual intercourse for at least 6 weeks, or until your health care provider gives you permission. °· Do not lift anything that is heavier than 10 lb (4.5 kg), or the limit that your health care provider tells you, until he or she says that it is safe. °General instructions °· Do not take baths, swim, or use a hot tub until your health care provider approves. Take showers instead of baths. °· Do not drive for 24 hours if you received a sedative. °· Do not drive or operate heavy machinery while taking prescription pain medicine. °· To prevent or treat constipation while you are taking prescription pain medicine, your health care provider may recommend that you: °? Drink enough fluid to keep your urine clear or pale yellow. °? Take over-the-counter or prescription medicines. °? Eat foods that are high in fiber, such as fresh fruits and vegetables, whole grains, and beans. °? Limit foods that are high in fat and processed sugars, such as fried and sweet foods. °· Keep all follow-up visits as told by your health care provider. This is important. °Contact a health care provider if: °· You have signs of infection, such as: °? Redness, swelling, or pain around your incision sites. °? Fluid or blood coming from an incision. °? An incision that feels warm to the   touch. °? Pus or a bad smell coming from an incision. °· Your incision breaks open. °· Your pain medicine is not helping. °· You feel dizzy or light-headed. °· You have pain or bleeding when you urinate. °· You have persistent nausea and vomiting. °· You have blood, pus, or a bad-smelling discharge from your vagina. °Get help right away if: °· You have a fever. °· You have severe abdominal pain. °· You have chest pain. °· You have  shortness of breath. °· You faint. °· You have pain, swelling, or redness in your leg. °· You have heavy bleeding from your vagina. °Summary °· After the procedure, it is common to have abdominal pain and vaginal bleeding. °· You should not drive or lift heavy objects until your health care provider says that it is safe. °· Contact your health care provider if you have any symptoms of infection, excessive vaginal bleeding, nausea, vomiting, or shortness of breath. °This information is not intended to replace advice given to you by your health care provider. Make sure you discuss any questions you have with your health care provider. °Document Released: 10/03/2011 Document Revised: 12/10/2016 Document Reviewed: 12/10/2016 °Elsevier Interactive Patient Education © 2019 [employer] ° °

## 2018-12-08 NOTE — Progress Notes (Signed)
Packing removed. Bleeding noted from vagina. Fairly brisk. Recommend returning to the OR for examination under anesthesia and resuturing of vaginal sulcal laceration.   Informed patient of this and consent provided.  Anesthesia aware.  Thereasa Solo, MD

## 2018-12-08 NOTE — Anesthesia Postprocedure Evaluation (Signed)
Anesthesia Post Note  Patient: Ashley Moon  Procedure(s) Performed: XI ROBOTIC ASSISTED TOTAL HYSTERECTOMY WITH BILATERAL SALPINGO OOPHORECTOMY (Bilateral ) SENTINEL NODE BIOPSY (N/A )     Patient location during evaluation: PACU Anesthesia Type: General Level of consciousness: awake and alert Pain management: pain level controlled Vital Signs Assessment: post-procedure vital signs reviewed and stable Respiratory status: spontaneous breathing, nonlabored ventilation, respiratory function stable and patient connected to nasal cannula oxygen Cardiovascular status: blood pressure returned to baseline and stable Postop Assessment: no apparent nausea or vomiting Anesthetic complications: no    Last Vitals:  Vitals:   12/08/18 1745 12/08/18 1805  BP: (!) 141/70 134/73  Pulse: 100 (!) 102  Resp: 15   Temp:    SpO2: 100% 97%    Last Pain:  Vitals:   12/08/18 1745  TempSrc:   PainSc: 0-No pain                 Talibah Colasurdo S

## 2018-12-08 NOTE — Transfer of Care (Signed)
Immediate Anesthesia Transfer of Care Note  Patient: Ashley Moon  Procedure(s) Performed: Repair of vaginal laceration (N/A )  Patient Location: PACU  Anesthesia Type:General  Level of Consciousness: awake, alert , oriented and patient cooperative  Airway & Oxygen Therapy: Patient Spontanous Breathing and Patient connected to face mask oxygen  Post-op Assessment: Report given to RN and Post -op Vital signs reviewed and stable  Post vital signs: Reviewed and stable  Last Vitals:  Vitals Value Taken Time  BP 132/86 12/08/2018  5:15 PM  Temp    Pulse 99 12/08/2018  5:17 PM  Resp 18 12/08/2018  5:17 PM  SpO2 100 % 12/08/2018  5:17 PM  Vitals shown include unvalidated device data.  Last Pain:  Vitals:   12/08/18 1530  TempSrc:   PainSc: 1       Patients Stated Pain Goal: 4 (20/94/70 9628)  Complications: No apparent anesthesia complications

## 2018-12-08 NOTE — Anesthesia Preprocedure Evaluation (Signed)
Anesthesia Evaluation  Patient identified by MRN, date of birth, ID band Patient awake    Reviewed: Allergy & Precautions, H&P , NPO status , Patient's Chart, lab work & pertinent test results  Airway Mallampati: II   Neck ROM: full    Dental   Pulmonary asthma , former smoker,    breath sounds clear to auscultation       Cardiovascular negative cardio ROS   Rhythm:regular Rate:Normal     Neuro/Psych    GI/Hepatic   Endo/Other  obese  Renal/GU stones     Musculoskeletal   Abdominal   Peds  Hematology   Anesthesia Other Findings   Reproductive/Obstetrics                             Anesthesia Physical Anesthesia Plan  ASA: II  Anesthesia Plan: General   Post-op Pain Management:    Induction: Intravenous  PONV Risk Score and Plan: 3 and Ondansetron, Dexamethasone, Midazolam and Treatment may vary due to age or medical condition  Airway Management Planned: Oral ETT  Additional Equipment:   Intra-op Plan:   Post-operative Plan: Extubation in OR  Informed Consent: I have reviewed the patients History and Physical, chart, labs and discussed the procedure including the risks, benefits and alternatives for the proposed anesthesia with the patient or authorized representative who has indicated his/her understanding and acceptance.       Plan Discussed with: CRNA, Anesthesiologist and Surgeon  Anesthesia Plan Comments:         Anesthesia Quick Evaluation

## 2018-12-08 NOTE — Op Note (Signed)
OPERATIVE NOTE 12/08/18  Surgeon: Donaciano Eva   Assistants: Dr Lahoma Crocker (an MD assistant was necessary for tissue manipulation, management of robotic instrumentation, retraction and positioning due to the complexity of the case and hospital policies).   Anesthesia: General endotracheal anesthesia  ASA Class: 3  Pre-operative Diagnosis: complex atypical hyperplasia  Post-operative Diagnosis: same   Operation: Robotic-assisted laparoscopic total hysterectomy with bilateral salpingoophorectomy, SLN biopsy, lysis of adhesions, placement of vaginal packing (22 modifier for obesity).  Surgeon: Donaciano Eva  Assistant Surgeon: Lahoma Crocker MD  Anesthesia: GET  Urine Output: 500cc  Operative Findings:  : obese abdomen and retroperitoneum. Very narrow upper vagina and small cervix.  8cm fibroid uterus. Dense adhesions between uterus and sigmoid colon and bladder. Prominent retroperitoneal vessels. Unilateral mapping on left. Vaginal bleeding from sulcus of vagina from specimen delivery. Vaginal packing placed.   Estimated Blood Loss:  less than 50 mL      Total IV Fluids: 800 ml         Specimens: uterus, cervix, bilateral tubes and ovaries, left external iliac SLN, left obturator SLN.          Complications:  None; patient tolerated the procedure well.         Disposition: PACU - hemodynamically stable.  Procedure Details  The patient was seen in the Holding Room. The risks, benefits, complications, treatment options, and expected outcomes were discussed with the patient.  The patient concurred with the proposed plan, giving informed consent.  The site of surgery properly noted/marked. The patient was identified as Ashley Moon and the procedure verified as a Robotic-assisted hysterectomy with bilateral salpingo oophorectomy with SLN biopsy. A Time Out was held and the above information confirmed.  After induction of anesthesia, the patient was draped  and prepped in the usual sterile manner. Pt was placed in supine position after anesthesia and draped and prepped in the usual sterile manner. The abdominal drape was placed after the CholoraPrep had been allowed to dry for 3 minutes.  Her arms were tucked to her side with all appropriate precautions.  The shoulders were stabilized with padded shoulder blocks applied to the acromium processes.  The patient was placed in the semi-lithotomy position in [place].  The perineum was prepped with Betadine. The patient was then prepped. Foley catheter was placed.  A sterile speculum was placed in the vagina.  The cervix was grasped with a single-tooth tenaculum. 2mg  total of ICG was injected into the cervical stroma at 2 and 9 o'clock with 1cc injected at a 1cm and 34mm depth (concentration 0.5mg /ml) in all locations. The cervix was dilated with Kennon Rounds dilators.  The ZUMI uterine manipulator with a small colpotomizer ring was placed with some difficulty due to her significantly narrowed upper vagina.  A pneum occluder balloon was placed over the manipulator.  OG tube placement was confirmed and to suction.   Next, a 5 mm skin incision was made 1 cm below the subcostal margin in the midclavicular line.  The 5 mm Optiview port and scope was used for direct entry.  Opening pressure was under 10 mm CO2.  The abdomen was insufflated and the findings were noted as above.   At this point and all points during the procedure, the patient's intra-abdominal pressure did not exceed 15 mmHg. Next, a 10 mm skin incision was made in the umbilicus and a right and left port was placed about 10 cm lateral to the robot port on the right and  left side.  A fourth arm was placed in the left lower quadrant 2 cm above and superior and medial to the anterior superior iliac spine.  All ports were placed under direct visualization.  The patient was placed in steep Trendelenburg.  Bowel was folded away into the upper abdomen.  The robot was  docked in the normal manner.  For 30 minutes sharp adhesiolysis was performed to separate the uterus from its dense adhesions to the sigmoid colon and posterior uterus and anterior uterus and bladder. There were ovarian and side-wall adhesions also.   The right and left peritoneum were opened parallel to the IP ligament to open the retroperitoneal spaces bilaterally. The SLN mapping was performed in bilateral pelvic basins. The para rectal and paravesical spaces were opened up entirely with careful dissection below the level of the ureters bilaterally and to the depth of the uterine artery origin in order to skeletonize the uterine "web" and ensure visualization of all parametrial channels. The para-aortic basins were carefully exposed and evaluated for isolated para-aortic SLN's. Lymphatic channels were identified travelling to the following visualized sentinel lymph node's: left obturator and external iliac SLN. These SLN's were separated from their surrounding lymphatic tissue, removed and sent for permanent pathology.  The hysterectomy was started after the round ligament on the right side was incised and the retroperitoneum was entered and the pararectal space was developed.  The ureter was noted to be on the medial leaf of the broad ligament.  The peritoneum above the ureter was incised and stretched and the infundibulopelvic ligament was skeletonized, cauterized and cut.  The posterior peritoneum was taken down to the level of the KOH ring.  The anterior peritoneum was also taken down.  The bladder flap was created to the level of the KOH ring.  The uterine artery on the right side was skeletonized, cauterized and cut in the normal manner.  A similar procedure was performed on the left.  The colpotomy was made and the uterus, cervix, bilateral ovaries and tubes were amputated and delivered through the vagina This was a challenging specimen delivery due to the narrowness of the upper vagina. Sulcal  bleeding from the right vaginal sidewall was noted.  Vascular pedicles were inspected and excellent hemostasis was achieved.    The proximal vaginal sulcus was made hemostatic with a 2 layer closure of 0-vicryl running suture from the mid vagina to the cuff. The colpotomy at the vaginal cuff was closed with Vicryl on a CT1 needle in a running manner.  Irrigation was used and excellent hemostasis was achieved.  At this point in the procedure was completed.  Robotic instruments were removed under direct visulaization.  The robot was undocked. The 10 mm ports were closed with Vicryl on a UR-5 needle and the fascia was closed with 0 Vicryl on a UR-5 needle.  The skin was closed with 4-0 Vicryl in a subcuticular manner.  Dermabond was applied.    The speculum was placed again in the vagina and the distal right vaginal sulcal region was closed with running 2-0 vicryl suture.  There was bleeding at the midline introitus which was made hemostatic with 2-0 vicryl figure of 8 sutures. Sponge, lap and needle counts correct x 2.  There was some continued ooze from the vagina and so there was moistened kerlex placed in the vagina as a vaginal packing. The foley was replaced. The patient was taken to the recovery room in stable condition.  The vagina was swabbed with  minimal bleeding noted. All instrument and needle counts were correct x 3.   The patient was transferred to the recovery room in a stable condition.  Donaciano Eva, MD

## 2018-12-08 NOTE — Anesthesia Procedure Notes (Signed)
Procedure Name: Intubation Date/Time: 12/08/2018 10:17 AM Performed by: [place] Andrianna Manalang C, CRNA Pre-anesthesia Checklist: Patient identified, Emergency Drugs available, Suction available and Patient being monitored Patient Re-evaluated:Patient Re-evaluated prior to induction Oxygen Delivery Method: Circle system utilized Preoxygenation: Pre-oxygenation with 100% oxygen Induction Type: IV induction Ventilation: Mask ventilation without difficulty Laryngoscope Size: Mac and 3 Grade View: Grade II Tube type: Oral Tube size: 7.0 mm Number of attempts: 1 Airway Equipment and Method: Stylet Placement Confirmation: ETT inserted through vocal cords under direct vision,  positive ETCO2 and breath sounds checked- equal and bilateral Secured at: 22 cm Tube secured with: Tape Dental Injury: Teeth and Oropharynx as per pre-operative assessment

## 2018-12-08 NOTE — Anesthesia Preprocedure Evaluation (Signed)
Anesthesia Evaluation  Patient identified by MRN, date of birth, ID band Patient awake    Reviewed: Allergy & Precautions, H&P , NPO status , Patient's Chart, lab work & pertinent test results  Airway Mallampati: II   Neck ROM: full    Dental   Pulmonary asthma , former smoker,    breath sounds clear to auscultation       Cardiovascular negative cardio ROS   Rhythm:regular Rate:Normal     Neuro/Psych    GI/Hepatic   Endo/Other  obese  Renal/GU stones     Musculoskeletal   Abdominal   Peds  Hematology   Anesthesia Other Findings   Reproductive/Obstetrics                             Anesthesia Physical  Anesthesia Plan  ASA: II and emergent  Anesthesia Plan: General   Post-op Pain Management:    Induction: Intravenous, Cricoid pressure planned and Rapid sequence  PONV Risk Score and Plan: 3 and Ondansetron, Dexamethasone, Midazolam and Treatment may vary due to age or medical condition  Airway Management Planned: Oral ETT  Additional Equipment:   Intra-op Plan:   Post-operative Plan: Extubation in OR  Informed Consent: I have reviewed the patients History and Physical, chart, labs and discussed the procedure including the risks, benefits and alternatives for the proposed anesthesia with the patient or authorized representative who has indicated his/her understanding and acceptance.       Plan Discussed with: CRNA, Anesthesiologist and Surgeon  Anesthesia Plan Comments:         Anesthesia Quick Evaluation

## 2018-12-08 NOTE — Anesthesia Procedure Notes (Signed)
Procedure Name: Intubation Performed by: [place] Pugh, CRNA Pre-anesthesia Checklist: Patient identified, Emergency Drugs available, Suction available, Patient being monitored and Timeout performed Patient Re-evaluated:Patient Re-evaluated prior to induction Oxygen Delivery Method: Circle system utilized Preoxygenation: Pre-oxygenation with 100% oxygen Induction Type: IV induction, Rapid sequence and Cricoid Pressure applied Laryngoscope Size: Mac and 3 Grade View: Grade I Tube type: Oral Tube size: 7.0 mm Number of attempts: 1 Airway Equipment and Method: Stylet Placement Confirmation: ETT inserted through vocal cords under direct vision,  positive ETCO2,  CO2 detector and breath sounds checked- equal and bilateral Secured at: 22 cm Tube secured with: Tape Dental Injury: Teeth and Oropharynx as per pre-operative assessment

## 2018-12-08 NOTE — Op Note (Signed)
PATIENT: Ashley Moon DATE: 12/08/18   Preop Diagnosis: vaginal bleeding after hysterectomy  Postoperative Diagnosis: same  Surgery: repair of vaginal laceration  Surgeons:  Donaciano Eva, MD Assistant: Dr Lahoma Crocker.  Anesthesia: General   Estimated blood loss: <68ml  IVF:  218ml   Urine output: 829 ml   Complications: None   Pathology: none  Operative findings: active bleeding from distal vaginal lacerations on right vaginal sidewall and left and right introitus. Cuff hemostatic  Procedure: The patient was identified in the preoperative holding area. Informed consent was signed on the chart. Patient was seen history was reviewed and exam was performed.   The patient was then taken to the operating room and placed in the supine position with SCD hose on. General anesthesia was then induced without difficulty. She was then placed in the dorsolithotomy position. The perineum was prepped with Betadine. The vagina was gently prepped with Betadine. The patient was then draped after the prep was dried. A foley catheter was already in situ. Timeout was performed the patient, procedure, antibiotic, allergy, and length of procedure.   Deaver retractors were placed in the vagina to expose the bleeding. The cuff was visualized and was clearly hemostatic. There was active bleeding noted from the distal vaginal sidewall (right) and the left and right introitus. These were made hemostatic with 0-vicryl suture in running or figure of 8 sutures. The vagina was irrigated. Observation took place to confirm hemostasis. All instrument, suture, laparotomy, Ray-Tec, and needle counts were correct x2. The patient tolerated the procedure well and was taken recovery room in stable condition. This is Everitt Amber dictating an operative note on Jerline Linzy.  Thereasa Solo, MD

## 2018-12-09 ENCOUNTER — Encounter (HOSPITAL_COMMUNITY): Payer: Self-pay | Admitting: Gynecologic Oncology

## 2018-12-09 ENCOUNTER — Telehealth: Payer: Self-pay

## 2018-12-09 NOTE — Telephone Encounter (Signed)
Ashley Moon states that she is using tylenol alternating with  Ibuprofen for pain.  She has not needed to take the Oxycodone. She is passing gas and belching. She is using a heating pad to her back for the gas pain  With good effect. Eating well. She is having light spotting the the pad. Told her to call if she has any bleeding like a period. Pt verbalized understanding.

## 2018-12-10 ENCOUNTER — Telehealth: Payer: Self-pay

## 2018-12-10 NOTE — Telephone Encounter (Signed)
Told Ms Liska that the pathology showed the pre- cancer or hyperplasia known in the uterus. The lymph nodes are negative.  No further treatment needed. Pt verbalized understanding.

## 2018-12-10 NOTE — Anesthesia Postprocedure Evaluation (Signed)
Anesthesia Post Note  Patient: Ashley Moon  Procedure(s) Performed: Repair of vaginal laceration (N/A )     Patient location during evaluation: PACU Anesthesia Type: General Level of consciousness: awake and alert Pain management: pain level controlled Vital Signs Assessment: post-procedure vital signs reviewed and stable Respiratory status: spontaneous breathing, nonlabored ventilation, respiratory function stable and patient connected to nasal cannula oxygen Cardiovascular status: blood pressure returned to baseline and stable Postop Assessment: no apparent nausea or vomiting Anesthetic complications: no    Last Vitals:  Vitals:   12/08/18 1745 12/08/18 1805  BP: (!) 141/70 134/73  Pulse: 100 (!) 102  Resp: 15 16  Temp:  36.9 C  SpO2: 100% 97%    Last Pain:  Vitals:   12/08/18 1805  TempSrc:   PainSc: 0-No pain                 Calen Posch S

## 2018-12-11 ENCOUNTER — Telehealth: Payer: Self-pay

## 2018-12-11 NOTE — Telephone Encounter (Signed)
Outgoing call per Joylene John NP to [facility] at [facility]- for magic mouthwash- 1 part visous lidocaine 2%, 1 part maalox, 1 part diphenhydramine 12.5 mg per 5 ml elixir - quanity 120 ml.  Pharmacist Jenny Reichmann.  I let pt know it has been called in.

## 2018-12-11 NOTE — Telephone Encounter (Signed)
Outgoing call to patient -returned her call regarding "having a fever and can't swallow food"- Pt reports her throat has been sore since after surgery and now is "like swallowing a knife"  -has tried halls cough drops but not helping.  Reports she had a fever of 101 at 10 am and now it is 99.  Reports she is alternating Ibuprofen and Tylenol.  Notified Dr Denman George and Joylene John NP.  Per them, they will prescribe Magic Mouthwash - swish and swallow and  Encouraged to eat soft foods and cold liquids like popsickles- pt voiced understanding.  Per Dr Denman George, continue to monitor for fever and if still having fevers tomorrow then patient to call on-call doctor [phone].  Also Continue to alternate Ibuprofen and Tylenol and monitor for any changes like abd pain or issues with incision- then call sooner then tomorrow.Pt voiced understanding.  Pt reports she is doing well otherwise, no abd pain or issue with incision at this time. No other needs per pt at this time.

## 2018-12-29 ENCOUNTER — Encounter: Payer: Self-pay | Admitting: Gynecologic Oncology

## 2018-12-29 ENCOUNTER — Inpatient Hospital Stay: Payer: 59 | Attending: Gynecologic Oncology | Admitting: Gynecologic Oncology

## 2018-12-29 VITALS — BP 134/78 | HR 64 | Temp 98.7°F | Resp 18 | Ht 62.0 in | Wt 201.0 lb

## 2018-12-29 DIAGNOSIS — Z90722 Acquired absence of ovaries, bilateral: Secondary | ICD-10-CM

## 2018-12-29 DIAGNOSIS — Z90710 Acquired absence of both cervix and uterus: Secondary | ICD-10-CM | POA: Insufficient documentation

## 2018-12-29 DIAGNOSIS — N8502 Endometrial intraepithelial neoplasia [EIN]: Secondary | ICD-10-CM | POA: Insufficient documentation

## 2018-12-29 DIAGNOSIS — Z87891 Personal history of nicotine dependence: Secondary | ICD-10-CM | POA: Insufficient documentation

## 2018-12-29 NOTE — Progress Notes (Signed)
Follow-up Note: Gyn-Onc  Consult was initially requested by Dr. Nori Riis for the evaluation of Ashley Moon 58 y.o. female  CC:  Chief Complaint  Patient presents with  . Complex atypical endometrial hyperplasia    Follow up post-op    Assessment/Plan:  Ashley. [name]  is a 58 y.o.  year old with complex atypical hyperplasia of the endometrium s/p hysterectomy on 12/08/18.  She is doing well postop and therefore no additional recommendations at this time.   She will follow-up with Dr Nori Riis for annual wellness checks.   HPI: Ashley Moon is a 58 year old P0 who is seen in consultation at the request of Dr Nori Riis for complex atypical hyperplasia.  The patient reports that she became menopausal in approximately 2017.  In September 2019 she began having intermittent irregular vaginal bleeding that she felt was triggered by heavy lifting.  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  When the bleeding persisted she was evaluated by Dr. Milta Deiters.  He performed a Pap smear in November 2019 which was unremarkable.  He also performed a transvaginal ultrasound on September 09, 2018 which revealed a slightly enlarged uterus measuring 8.5 x 6.2 x 7.2 cm with an endometrial thickness of 2.32 cm.  The left ovary was not visualized, however no adnexal masses was seen.  No right adnexal masses were seen either.  She was taken to the operating room for a surgical procedure with hysteroscopy on October 26, 2018 and sampling of an endometrial polyp took place which confirmed an endometrium with complex atypical hyperplasia.  There was also removal of submucosal benign leiomyomas.  The patient is obese, weighs 200 pounds and is 43foot 2 inches tall.  She has had a prior abdominal myomectomy with ovarian cyst removal approximately 2011.  She is also had as a separate procedure an open appendectomy.  Her sister has a history of uterine cancer.  The patient is not treated for diabetes.  Interval Hx:  On  12/08/18 she underwent robotic assisted total hysterectomy, BSO, SLN biopsy. Surgery was complicated by a vaginal sidewall laceration which required repair. She was taken back to the OR that same day for additional sutures to be placed for vaginal bleeding.  Postoperatively she has done well.  Final pathology revealed CAH, no invasive carcinoma.   Current Meds:  Outpatient Encounter Medications as of 12/29/2018  Medication Sig  . acetaminophen (TYLENOL) 500 MG tablet Take 1,000 mg by mouth at bedtime.   Marland Kitchen acetaminophen-codeine (TYLENOL #3) 300-30 MG tablet Take 1-2 tablets by mouth every 4 (four) hours as needed for moderate pain.  . diphenhydrAMINE (BENADRYL) 25 mg capsule Take 50 mg by mouth daily as needed for itching.   . [DISCONTINUED] oxyCODONE-acetaminophen (PERCOCET/ROXICET) 5-325 MG tablet Take 1-2 tablets by mouth every 4 (four) hours as needed for severe pain.  . [DISCONTINUED] progesterone (PROMETRIUM) 100 MG capsule Take 100 mg by mouth at bedtime.   No facility-administered encounter medications on file as of 12/29/2018.     Allergy:  Allergies  Allergen Reactions  . Adhesive [Tape] Itching    Itching, rash  . Erythromycin Diarrhea  . Pineapple     throat itching  . Neosporin [Neomycin-Bacitracin Zn-Polymyx] Rash  . Sulfa Antibiotics Rash    Social Hx:   Social History   Socioeconomic History  . Marital status: Single    Spouse name: Not on file  . Number of children: Not on file  . Years of education: Not  on file  . Highest education level: Not on file  Occupational History  . Not on file  Social Needs  . Financial resource strain: Not on file  . Food insecurity:    Worry: Not on file    Inability: Not on file  . Transportation needs:    Medical: Not on file    Non-medical: Not on file  Tobacco Use  . Smoking status: Former Smoker    Last attempt to quit: 10/29/2003    Years since quitting: 15.1  . Smokeless tobacco: Never Used  Substance and Sexual  Activity  . Alcohol use: No  . Drug use: No  . Sexual activity: Not on file  Lifestyle  . Physical activity:    Days per week: Not on file    Minutes per session: Not on file  . Stress: Not on file  Relationships  . Social connections:    Talks on phone: Not on file    Gets together: Not on file    Attends religious service: Not on file    Active member of club or organization: Not on file    Attends meetings of clubs or organizations: Not on file    Relationship status: Not on file  . Intimate partner violence:    Fear of current or ex partner: Not on file    Emotionally abused: Not on file    Physically abused: Not on file    Forced sexual activity: Not on file  Other Topics Concern  . Not on file  Social History Narrative  . Not on file    Past Surgical Hx:  Past Surgical History:  Procedure Laterality Date  . APPENDECTOMY  1983  . COLONOSCOPY    . COLONOSCOPY W/ POLYPECTOMY    . DILATION AND CURETTAGE OF UTERUS N/A 12/08/2018   Procedure: Repair of vaginal laceration;  Surgeon: Everitt Amber, MD;  Location: [facility];  Service: Gynecology;  Laterality: N/A;  . DILATION AND CURETTAGE, DIAGNOSTIC / THERAPEUTIC  10/26/2018   Dr Milta Deiters   . INGUINAL HERNIA REPAIR Right 1996  . MYOMECTOMY  2007   rib injury occurred at this time; pt denies  'myomectomy '  . OVARIAN CYST REMOVAL  2007  . ROBOTIC ASSISTED TOTAL HYSTERECTOMY WITH BILATERAL SALPINGO OOPHERECTOMY Bilateral 12/08/2018   Procedure: XI ROBOTIC ASSISTED TOTAL HYSTERECTOMY WITH BILATERAL SALPINGO OOPHORECTOMY;  Surgeon: Everitt Amber, MD;  Location: [facility];  Service: Gynecology;  Laterality: Bilateral;  . SALIVARY GLAND SURGERY Left 1998  . SENTINEL NODE BIOPSY N/A 12/08/2018   Procedure: SENTINEL NODE BIOPSY;  Surgeon: Everitt Amber, MD;  Location: [facility];  Service: Gynecology;  Laterality: N/A;    Past Medical Hx:  Past Medical History:  Diagnosis Date  . Anemia   . Asthma    dx early 31s   . Chronic kidney disease    . Complex atypical endometrial hyperplasia   . Complication of anesthesia    reports  she had a spinal for a previous surgery, and her bp dropped very low ; " was awake for the whole thing and and i was sick to my stomach "   . Elevated blood pressure reading   . History of kidney stones   . Personal history of colonic polyps - adenomas 11/29/2013    Past Gynecological History:  G0, virginal. Patient's last menstrual period was 07/16/2018 (lmp unknown).  Family Hx:  Family History  Problem Relation Age of Onset  . Kidney disease Father   . Diabetes  Father   . Inflammatory bowel disease Father   . Stroke Father   . Uterine cancer Sister   . Colon cancer Neg Hx     Review of Systems:  Constitutional  Feels well,    ENT Normal appearing ears and nares bilaterally Skin/Breast  No rash, sores, jaundice, itching, dryness Cardiovascular  No chest pain, shortness of breath, or edema  Pulmonary  No cough or wheeze.  Gastro Intestinal  No nausea, vomitting, or diarrhoea. No bright red blood per rectum, no abdominal pain, change in bowel movement, or constipation.  Genito Urinary  No frequency, urgency, dysuria, no bleeding. Musculo Skeletal  No myalgia, arthralgia, joint swelling or pain  Neurologic  No weakness, numbness, change in gait,  Psychology  No depression, anxiety, insomnia.   Vitals:  Blood pressure 134/78, pulse 64, temperature 98.7 F (37.1 C), resp. rate 18, height 5\' 2"  (1.575 m), weight 201 lb (91.2 kg), last menstrual period 07/16/2018, SpO2 98 %.  Physical Exam: WD in NAD Neck  Supple NROM, without any enlargements.  Lymph Node Survey No cervical supraclavicular or inguinal adenopathy Cardiovascular  Pulse normal rate, regularity and rhythm. S1 and S2 normal.  Lungs  Clear to auscultation bilateraly, without wheezes/crackles/rhonchi. Good air movement.  Skin  No rash/lesions/breakdown  Psychiatry  Alert and oriented to person, place, and time   Abdomen  Normoactive bowel sounds, abdomen soft, non-tender and obese without evidence of hernia. Incisions well healed.  Back No CVA tenderness Genito Urinary  Suture material on vaginal wall in tact, no bleeding, no lesions, no cellulitis.  Rectal  deferred Extremities  No bilateral cyanosis, clubbing or edema.   Thereasa Solo, MD  12/29/2018, 2:01 PM

## 2018-12-29 NOTE — Patient Instructions (Signed)
Ashley Moon, Your pathology did not show cancer and therefore no follow-up is necessary.  For your mood irritability, your symptoms should gradually resolve as your body becomes used to no hormones from the ovaries. We do not typically prescribe estrogen replacement therapy after age 56.   Dr Denman George can be reached at [phone] with any questions.

## 2018-12-29 NOTE — Progress Notes (Signed)
See note

## 2022-02-22 ENCOUNTER — Encounter: Payer: Self-pay | Admitting: Internal Medicine

## 2022-03-05 ENCOUNTER — Telehealth: Payer: Self-pay | Admitting: Internal Medicine

## 2022-03-05 ENCOUNTER — Other Ambulatory Visit: Payer: Self-pay

## 2022-03-05 NOTE — Telephone Encounter (Signed)
Inbound call from patient stating that she was just diagnosed with a fatty liver and gallstones. Patient is seeking advice if she needs to come in for a visit with Dr. Carlean Purl before her colonoscopy  on 7/17. Please advise.  ?

## 2022-03-05 NOTE — Telephone Encounter (Signed)
Pt stated that she recently has an US done that showed a fatty liver, gallstones along with lab work that was elevated. Pt stated that she was notified to contact her GI Dr and set an appointment up. Pt was scheduled with Dr. Carlean Purl on 04/16/2022 at 2:10: Pt made aware:  ?Pt verbalized understanding with all questions answered.  ? ?

## 2022-04-16 ENCOUNTER — Ambulatory Visit: Payer: 59 | Admitting: Internal Medicine

## 2022-05-13 ENCOUNTER — Encounter: Payer: 59 | Admitting: Internal Medicine
